# Patient Record
Sex: Male | Born: 1970
Health system: Southern US, Community
[De-identification: ages and names within clinical notes are randomized; demographics above are authoritative.]

## PROBLEM LIST (undated history)

## (undated) DIAGNOSIS — F411 Generalized anxiety disorder: Secondary | ICD-10-CM

## (undated) DIAGNOSIS — N200 Calculus of kidney: Secondary | ICD-10-CM

## (undated) DIAGNOSIS — E785 Hyperlipidemia, unspecified: Secondary | ICD-10-CM

## (undated) DIAGNOSIS — M1990 Unspecified osteoarthritis, unspecified site: Secondary | ICD-10-CM

## (undated) DIAGNOSIS — T7840XA Allergy, unspecified, initial encounter: Secondary | ICD-10-CM

## (undated) DIAGNOSIS — G4730 Sleep apnea, unspecified: Secondary | ICD-10-CM

## (undated) HISTORY — DX: Hyperlipidemia, unspecified: E78.5

## (undated) HISTORY — DX: Sleep apnea, unspecified: G47.30

## (undated) HISTORY — DX: Generalized anxiety disorder: F41.1

## (undated) HISTORY — DX: Calculus of kidney: N20.0

## (undated) HISTORY — DX: Allergy, unspecified, initial encounter: T78.40XA

## (undated) HISTORY — DX: Unspecified osteoarthritis, unspecified site: M19.90

---

## 1989-09-24 HISTORY — PX: INGUINAL HERNIA REPAIR: SHX194

## 2004-02-23 HISTORY — PX: VASECTOMY: SHX75

## 2004-09-24 DIAGNOSIS — F411 Generalized anxiety disorder: Secondary | ICD-10-CM

## 2004-09-24 HISTORY — DX: Generalized anxiety disorder: F41.1

## 2011-05-16 ENCOUNTER — Encounter: Payer: Self-pay | Admitting: Family Medicine

## 2011-05-16 ENCOUNTER — Ambulatory Visit (INDEPENDENT_AMBULATORY_CARE_PROVIDER_SITE_OTHER): Payer: Managed Care, Other (non HMO) | Admitting: Family Medicine

## 2011-05-16 VITALS — BP 108/76 | HR 62 | Ht 74.0 in | Wt 214.0 lb

## 2011-05-16 DIAGNOSIS — E781 Pure hyperglyceridemia: Secondary | ICD-10-CM

## 2011-05-16 DIAGNOSIS — Z0000 Encounter for general adult medical examination without abnormal findings: Secondary | ICD-10-CM

## 2011-05-16 LAB — POCT URINALYSIS DIPSTICK
Bilirubin, UA: NEGATIVE
Leukocytes, UA: NEGATIVE
Nitrite, UA: NEGATIVE
Urobilinogen, UA: NEGATIVE
pH, UA: 5

## 2011-05-16 NOTE — Patient Instructions (Signed)
HEALTH MAINTENANCE RECOMMENDATIONS:  It is recommended that you get at least 30 minutes of aerobic exercise at least 5 days/week (for weight loss, you may need as much as 60-90 minutes). This can be any activity that gets your heart rate up. This can be divided in 10-15 minute intervals if needed, but try and build up your endurance at least once a week.  Weight bearing exercise is also recommended twice weekly.  Eat a healthy diet with lots of vegetables, fruits and fiber.  "Colorful" foods have a lot of vitamins (ie green vegetables, tomatoes, red peppers, etc).  Limit sweet tea, regular sodas and alcoholic beverages, all of which has a lot of calories and sugar.  Up to 2 alcoholic drinks daily may be beneficial for women (unless trying to lose weight, watch sugars).  Drink a lot of water.  Sunscreen of at least SPF 30 should be used on all sun-exposed parts of the skin when outside between the hours of 10 am and 4 pm (not just when at beach or pool, but even with exercise, golf, tennis, and yard work!)  Use a sunscreen that says "broad spectrum" so it covers both UVA and UVB rays, and make sure to reapply every 1-2 hours.  Remember to change the batteries in your smoke detectors when changing your clock times in the spring and fall.  Use your seat belt every time you are in a car, and please drive safely and not be distracted with cell phones and texting while driving.

## 2011-05-16 NOTE — Progress Notes (Signed)
Gregory Wright is a 40 y.o. male who presents for a complete physical.  He has the following concerns: Triglycerides were borderline in the past, is fasting for repeat labs.  He has no complaints   There is no immunization history on file for this patient. Tetanus--patient can't recall, believes it UTD, might be documented in old records from [facility] Doesn't get flu shots Last colonoscopy: never Last PSA: never Ophtho:  scheduled for September Dentist: regularly Exercise: basketball, tennis (doubles), mountain biking--twice weekly  Past Medical History  Diagnosis Date  . Anxiety state, unspecified 2006    work-related, resolved    Past Surgical History  Procedure Date  . Inguinal hernia repair 1991    left  . Vasectomy     History   Social History  . Marital Status: Married    Spouse Name: Sherill Wegener    Number of Children: 2  . Years of Education: N/A   Occupational History  . finance    Social History Main Topics  . Smoking status: Never Smoker   . Smokeless tobacco: Never Used  . Alcohol Use: Yes     1-2 beers a week  . Drug Use: No  . Sexually Active: Yes    Birth Control/ Protection: Surgical     vasectomy   Other Topics Concern  . Not on file   Social History Narrative  . No narrative on file    Family History  Problem Relation Age of Onset  . Hypertension Father   . Graves' disease Father   . Diabetes Neg Hx   . Cancer Cousin 37    lung (farmer, nonsmoker)--1st cousin    No current outpatient prescriptions on file.  No Known Allergies  ROS: The patient denies anorexia, fever, weight changes, headaches,  vision loss, decreased hearing, ear pain, hoarseness, chest pain, palpitations, dizziness, syncope, dyspnea on exertion, cough, swelling, nausea, vomiting, diarrhea, constipation, abdominal pain, melena, hematochezia, indigestion/heartburn, hematuria, incontinence, erectile dysfunction, nocturia, weakened urine stream, dysuria,  joint pains,  numbness, tingling, weakness, tremor, suspicious skin lesions, depression, anxiety, abnormal bleeding/bruising, or enlarged lymph nodes  Seasonal allergies in spring/fall, no problems now.  PHYSICAL EXAM: BP 108/76  Pulse 62  Ht 6\' 2"  (1.88 m)  Wt 214 lb (97.07 kg)  BMI 27.48 kg/m2  General Appearance:    Alert, cooperative, no distress, appears stated age  Head:    Normocephalic, without obvious abnormality, atraumatic  Eyes:    PERRL, conjunctiva/corneas clear, EOM's intact, fundi    benign  Ears:    Normal TM's and external ear canals  Nose:   Nares normal, mucosa normal, no drainage or sinus   tenderness  Throat:   Lips, mucosa, and tongue normal; teeth and gums normal  Neck:   Supple, no lymphadenopathy;  thyroid:  no   enlargement/tenderness/nodules; no carotid   bruit or JVD  Back:    Spine nontender, no curvature, ROM normal, no CVA     tenderness  Lungs:     Clear to auscultation bilaterally without wheezes, rales or     ronchi; respirations unlabored  Chest Wall:    No tenderness or deformity   Heart:    Regular rate and rhythm, S1 and S2 normal, no murmur, rub   or gallop  Breast Exam:    No chest wall tenderness, masses or gynecomastia  Abdomen:     Soft, non-tender, nondistended, normoactive bowel sounds,    no masses, no hepatosplenomegaly  Genitalia:    Normal male external  genitalia without lesions.  Testicles without masses.  No inguinal hernias.  Rectal:    Normal sphincter tone, no masses or tenderness; guaiac negative stool.  Prostate smooth, no nodules, not enlarged.  Extremities:   No clubbing, cyanosis or edema  Pulses:   2+ and symmetric all extremities  Skin:   Skin color, texture, turgor normal, no rashes or lesions  Lymph nodes:   Cervical, supraclavicular, and axillary nodes normal  Neurologic:   CNII-XII intact, normal strength, sensation and gait; reflexes 2+ and symmetric throughout          Psych:   Normal mood, affect, hygiene and  grooming.    ASSESSMENT/PLAN: 1. Routine general medical examination at a health care facility  POCT Urinalysis Dipstick, Visual acuity screening  2. Pure hyperglyceridemia  Lipid panel    Recommended at least 30 minutes of aerobic activity at least 5 days/week, self-testicular exams. Immunization recommendations discussed--check Tetanus status in [facility] records when they arrive. See handout given to pt.

## 2011-05-17 ENCOUNTER — Encounter: Payer: Self-pay | Admitting: Family Medicine

## 2011-05-17 LAB — LIPID PANEL
HDL: 50 mg/dL (ref >39)
LDL Cholesterol: 130 mg/dL — ABNORMAL HIGH (ref 0–99)

## 2011-06-04 ENCOUNTER — Encounter: Payer: Self-pay | Admitting: *Deleted

## 2011-11-14 ENCOUNTER — Encounter: Payer: Self-pay | Admitting: Family Medicine

## 2011-11-14 ENCOUNTER — Ambulatory Visit (INDEPENDENT_AMBULATORY_CARE_PROVIDER_SITE_OTHER): Payer: Managed Care, Other (non HMO) | Admitting: Family Medicine

## 2011-11-14 DIAGNOSIS — N508 Other specified disorders of male genital organs: Secondary | ICD-10-CM

## 2011-11-14 DIAGNOSIS — N5089 Other specified disorders of the male genital organs: Secondary | ICD-10-CM | POA: Insufficient documentation

## 2011-11-14 NOTE — Patient Instructions (Signed)
Testicular Masses Most testicular masses, such as a growth or a swelling, are benign. This means they are not cancerous. Common types of testicular masses include:   Hydrocele is the most common benign testicular mass in an adult. Hydroceles are generally soft, painless scrotal swellings that are collections of fluid in the scrotal sac. These can rapidly change size as the fluid enters or leaves.   Spermatoceles are generally soft, painless, benign swellings that are cyst-like masses in the scrotum containing fluid. They can rapidly change size as the fluid enters or leaves. They are more prominent while standing or exercising. Sometimes, spermatoceles may cause a sensation of heaviness or a dull ache.   Varicocele is an enlargement of the veins that drain the testicles. This condition can increase the risk of infertility. They are more prominent while standing or exercising. Sometimes, varicoceles may cause a sensation of heaviness or a dull ache.   Inguinal hernia is a bulge caused by a portion of intestine protruding into the scrotum through a weak area in the abdominal muscles. Hernias may or may not be painful. They are soft and usually enlarge with coughing or straining.   Torsion of the testis can cause a testicular mass that develops quickly and is associated with tenderness and/or fever. This is caused by a twisting of the testicle within the sac. It also reduces the blood supply and can destroy the testis if not treated quickly with surgery.   Epididymitis is inflammation of the epididymis (a structure attached to the testicle), usually caused by a sexually transmitted infection or a urinary tract infection. This generally shows up as testicular discomfort and swelling, and may include pain during urination.   Testicular appendages are remnants of tissue on the testis present since birth. A testicular appendage can twist on its blood supply and cause pain. In most cases, this is seen as a  blue dot on the scrotum.  A cancerous growth in the scrotum may first appear as a swelling. There may or may not be pain. The growth usually feels firm and shows up as a growth on the testicle. Any solid, firm growth in a testicle is considered cancer until proven otherwise. Cancer of the testicle most commonly affects men 18 to 42 years old. Risk factors include prior testicular tumor and cryptorchidism (undescended testis). Occasionally, testicular cancer may appear with symptoms (problems) of metastasis. This means the tumor (abnormal growth) has spread and is causing other problems that may include cough, shortness of breath or weight loss. Monthly testicular self-exams are recommended for all men. Get in the habit of examining your own testicles. A good time is while taking a shower. Get to know what your testicles feel like so you will know if there is a new growth or change in them. DIAGNOSIS  See your caregiver if you feel a growth in your testicle. Sometimes, all that is needed to make the diagnosis (determine what is wrong) is a physical exam. Your caregiver may shine a bright light through the scrotum to help make the diagnosis. This is called transillumination. The light will shine easily through a collection of fluid but will usually not shine through a tumor. Other testing, including blood tests and an ultrasound exam, may be done. An ultrasound exam bounces harmless sound waves off the testicles and produces a black and white picture almost like that produced by a camera. Diagnosis of testicular cancer can be made by measuring several substances in the blood, called markers), that may  indicate the presence of certain cancers. TREATMENT  What is wrong determines how it is treated. Small hydroceles and spermatoceles often require no treatment. In some cases, however, they may be treated surgically. Hernias are repaired with surgery. Because epididymitis is usually caused by an infection, it is  usually treated with antibiotics. Varicoceles may be treated by surgery to tie off the affected veins. Testicular cancer treatment depends upon the type of cancer. Sometimes, some tissue is removed surgically as a way of trying to preserve the testicle but if a tumor is suspected, the preferred treatment is removal of the entire testicle. Further treatment may include watching the growth with strict follow-up, chemotherapy or radiation. If a growth has been found in a testicle, your caregiver will help you determine the best treatment. Document Released: 03/17/2003 Document Revised: 05/23/2011 Document Reviewed: 09/10/2005 [facility] Patient Information 2012 [place], [place].    The lumps in the scrotum may be related to prior vasectomy.  As long as it isn't painful, growing in size, or causing any problems, let's continue to observe.  Contact me if things change, become symptomatic

## 2011-11-14 NOTE — Progress Notes (Signed)
Monday night he noticed a marble-sized mass in left scrotum.  Nontender. Wife also noticed a lump on the right side, but much smaller in size. Hasn't changed in the last 2 days.  No injury or trauma. +h/o vasectomy 2005.   Denies fevers, dysuria, pain with ejaculation, bleed in semen, hematuria or other concerns.  Past Medical History  Diagnosis Date  . Anxiety state, unspecified 2006    work-related, resolved  . Allergy     seasonal    Past Surgical History  Procedure Date  . Inguinal hernia repair 1991    left  . Vasectomy 6/05    History   Social History  . Marital Status: Married    Spouse Name: Bernell Sigal    Number of Children: 2  . Years of Education: N/A   Occupational History  . finance    Social History Main Topics  . Smoking status: Never Smoker   . Smokeless tobacco: Never Used  . Alcohol Use: Yes     1-2 beers a week  . Drug Use: No  . Sexually Active: Yes    Birth Control/ Protection: Surgical     vasectomy   Other Topics Concern  . Not on file   Social History Narrative  . No narrative on file   Family History  Problem Relation Age of Onset  . Hypertension Father   . Graves' disease Father   . Diabetes Neg Hx   . Cancer Cousin 37    lung (farmer, nonsmoker)--1st cousin   No current outpatient prescriptions on file.  No Known Allergies  ROS:  Denies fevers, URI symptoms, cough, shortness of breath, chest pain, nausea, vomiting, diarrhea, GU complaints, skin rash, or other concerns  PHYSICAL EXAM: BP 100/78  Pulse 60  Ht 6\' 2"  (1.88 m)  Wt 215 lb (97.523 kg)  BMI 27.60 kg/m2 Well developed male, in no distress  L scrotum, inferoposteriorly--1.5-2cm mobile, firm, nontender mass, that is free-floating, and clearly separate from the testicle. Testicle is smooth, no nodules or masses.  R scrotum--normal testicle.  Much smaller firm nodule, 1/2 cm, located medially No hernias, no inguinal lymphadenopathy. Normal scrotal  skin  ASSESSMENT/PLAN: 1. Scrotal mass    Most likely sperm collection related to prior vasectomy.  Reviewed differential diagnosis of scrotal masses.  Reassured no testicular mass, likely benign.  Continued surveillance

## 2013-02-24 ENCOUNTER — Encounter: Payer: Self-pay | Admitting: Internal Medicine

## 2013-03-09 ENCOUNTER — Encounter: Payer: Self-pay | Admitting: Family Medicine

## 2013-03-09 ENCOUNTER — Ambulatory Visit (INDEPENDENT_AMBULATORY_CARE_PROVIDER_SITE_OTHER): Payer: Managed Care, Other (non HMO) | Admitting: Family Medicine

## 2013-03-09 VITALS — BP 104/76 | HR 60 | Ht 74.0 in | Wt 206.0 lb

## 2013-03-09 DIAGNOSIS — Z0000 Encounter for general adult medical examination without abnormal findings: Secondary | ICD-10-CM

## 2013-03-09 DIAGNOSIS — E7800 Pure hypercholesterolemia, unspecified: Secondary | ICD-10-CM

## 2013-03-09 DIAGNOSIS — R5381 Other malaise: Secondary | ICD-10-CM

## 2013-03-09 LAB — COMPREHENSIVE METABOLIC PANEL WITH GFR
ALT: 18 U/L (ref 0–53)
AST: 16 U/L (ref 0–37)
Albumin: 4.6 g/dL (ref 3.5–5.2)
Alkaline Phosphatase: 77 U/L (ref 39–117)
BUN: 18 mg/dL (ref 6–23)
CO2: 26 meq/L (ref 19–32)
Calcium: 9.6 mg/dL (ref 8.4–10.5)
Chloride: 101 meq/L (ref 96–112)
Creat: 1.06 mg/dL (ref 0.50–1.35)
Glucose, Bld: 107 mg/dL — ABNORMAL HIGH (ref 70–99)
Potassium: 4.4 meq/L (ref 3.5–5.3)
Sodium: 138 meq/L (ref 135–145)
Total Bilirubin: 0.7 mg/dL (ref 0.3–1.2)
Total Protein: 7.1 g/dL (ref 6.0–8.3)

## 2013-03-09 LAB — CBC WITH DIFFERENTIAL/PLATELET
Basophils Absolute: 0 K/uL (ref 0.0–0.1)
Basophils Relative: 0 % (ref 0–1)
Eosinophils Absolute: 0.1 K/uL (ref 0.0–0.7)
Eosinophils Relative: 1 % (ref 0–5)
HCT: 42.7 % (ref 39.0–52.0)
Hemoglobin: 15.1 g/dL (ref 13.0–17.0)
Lymphocytes Relative: 33 % (ref 12–46)
Lymphs Abs: 1.6 K/uL (ref 0.7–4.0)
MCH: 32.2 pg (ref 26.0–34.0)
MCHC: 35.4 g/dL (ref 30.0–36.0)
MCV: 91 fL (ref 78.0–100.0)
Monocytes Absolute: 0.3 K/uL (ref 0.1–1.0)
Monocytes Relative: 7 % (ref 3–12)
Neutro Abs: 2.8 K/uL (ref 1.7–7.7)
Neutrophils Relative %: 59 % (ref 43–77)
Platelets: 223 K/uL (ref 150–400)
RBC: 4.69 MIL/uL (ref 4.22–5.81)
RDW: 13.8 % (ref 11.5–15.5)
WBC: 4.9 K/uL (ref 4.0–10.5)

## 2013-03-09 LAB — POCT URINALYSIS DIPSTICK
Bilirubin, UA: NEGATIVE
Blood, UA: NEGATIVE
Glucose, UA: NEGATIVE
Ketones, UA: NEGATIVE
Leukocytes, UA: NEGATIVE
Nitrite, UA: NEGATIVE
Protein, UA: NEGATIVE
Spec Grav, UA: 1.01
Urobilinogen, UA: NEGATIVE
pH, UA: 7

## 2013-03-09 LAB — LIPID PANEL
Cholesterol: 202 mg/dL — ABNORMAL HIGH (ref 0–200)
HDL: 61 mg/dL
LDL Cholesterol: 126 mg/dL — ABNORMAL HIGH (ref 0–99)
Total CHOL/HDL Ratio: 3.3 ratio
Triglycerides: 76 mg/dL
VLDL: 15 mg/dL (ref 0–40)

## 2013-03-09 LAB — TSH: TSH: 0.933 u[IU]/mL (ref 0.350–4.500)

## 2013-03-09 NOTE — Patient Instructions (Addendum)

## 2013-03-09 NOTE — Progress Notes (Signed)
Chief Complaint  Patient presents with  . Annual Exam    fasting annual exam. DId not do eye exam as he just recently had one with Dr.Scott. No major concerns today.    Gregory Wright is a 42 y.o. male who presents for a complete physical.  He has the following concerns:  None. Scrotal mass unchanged. Occasional LBP, none currently.   Immunization History  Administered Date(s) Administered  . Tdap 03/26/2007  Doesn't get flu shots  Last colonoscopy: never  Last PSA: never  Ophtho: within 6 months, with Dr. Lorin Picket Dentist: regularly  Exercise: basketball, tennis (doubles), mountain biking occasionally; goes to gym 2x/week  Past Medical History  Diagnosis Date  . Anxiety state, unspecified 2006    work-related, resolved  . Allergy     seasonal  . Hyperlipidemia     mild    Past Surgical History  Procedure Laterality Date  . Inguinal hernia repair  1991    left  . Vasectomy  6/05    History   Social History  . Marital Status: Married    Spouse Name: Toni Hoffmeister    Number of Children: 2  . Years of Education: N/A   Occupational History  . finance and operations/process efficiency   .     Social History Main Topics  . Smoking status: Never Smoker   . Smokeless tobacco: Never Used  . Alcohol Use: Yes     Comment: 1-2 beers a week  . Drug Use: No  . Sexually Active: Yes    Birth Control/ Protection: Surgical     Comment: vasectomy   Other Topics Concern  . Not on file   Social History Narrative   Married, 2 sons, 2 dogs    Family History  Problem Relation Age of Onset  . Hypertension Father   . Graves' disease Father   . Diabetes Neg Hx   . Cancer Cousin 37    lung (farmer, nonsmoker)--1st cousin  . Cancer Maternal Aunt     spread to lungs, ?primary; also had brain tumor   No current outpatient prescriptions on file.  No Known Allergies  ROS: The patient denies anorexia, fever, weight changes, headaches, vision loss, decreased hearing, ear pain,  hoarseness, chest pain, palpitations, dizziness, syncope, dyspnea on exertion, cough, swelling, nausea, vomiting, diarrhea, constipation, abdominal pain, melena, hematochezia, indigestion/heartburn, hematuria, incontinence, erectile dysfunction, nocturia, weakened urine stream, dysuria, joint pains, numbness, tingling, weakness, tremor, suspicious skin lesions, depression, anxiety, abnormal bleeding/bruising, or enlarged lymph nodes  Seasonal allergies in spring/fall, no problems now. Uses claritin prn with good results (mostly when pollen is bad)  PHYSICAL EXAM: BP 104/76  Pulse 60  Ht 6\' 2"  (1.88 m)  Wt 206 lb (93.441 kg)  BMI 26.44 kg/m2  General Appearance:  Alert, cooperative, no distress, appears stated age   Head:  Normocephalic, without obvious abnormality, atraumatic   Eyes:  PERRL, conjunctiva/corneas clear, EOM's intact, fundi  benign   Ears:  Normal TM's and external ear canals   Nose:  Nares normal, mucosa normal, no drainage or sinus tenderness   Throat:  Lips, mucosa, and tongue normal; teeth and gums normal   Neck:  Supple, no lymphadenopathy; thyroid: no enlargement/tenderness/nodules; no carotid  bruit or JVD   Back:  Spine nontender, no curvature, ROM normal, no CVA tenderness   Lungs:  Clear to auscultation bilaterally without wheezes, rales or ronchi; respirations unlabored   Chest Wall:  No tenderness or deformity   Heart:  Regular rate and  rhythm, S1 and S2 normal, no murmur, rub  or gallop   Breast Exam:  No chest wall tenderness, masses or gynecomastia   Abdomen:  Soft, non-tender, nondistended, normoactive bowel sounds,  no masses, no hepatosplenomegaly   Genitalia:  Normal male external genitalia without lesions. Testicles without masses. No inguinal hernias.  L scrotum, inferoposteriorly--1.5-2cm mobile, firm, nontender mass, that is free-floating, and clearly separate from the testicle. Testicle is smooth, no nodules or masses. R scrotum--normal testicle. Much  smaller firm nodule, 1 cm, nontender. No adenopathy  Rectal:  Normal sphincter tone, no masses or tenderness; guaiac negative stool. Prostate smooth, no nodules, not enlarged.   Extremities:  No clubbing, cyanosis or edema   Pulses:  2+ and symmetric all extremities   Skin:  Skin color, texture, turgor normal, no rashes or lesions   Lymph nodes:  Cervical, supraclavicular, and axillary nodes normal   Neurologic:  CNII-XII intact, normal strength, sensation and gait; reflexes 2+ and symmetric throughout          Psych: Normal mood, affect, hygiene and grooming.    ASSESSMENT/PLAN:  Routine general medical examination at a health care facility - Plan: POCT Urinalysis Dipstick, Lipid panel, Comprehensive metabolic panel, CBC with Differential, TSH  Other malaise and fatigue - Plan: Comprehensive metabolic panel, CBC with Differential, TSH  Pure hypercholesterolemia - Plan: Lipid panel  Lipids reviewed from 2 years ago--were improved from those in years past.  Last glu was 2011.  Full chem panel was in 2006, never had CBC or thyroid testing.  Recommended at least 30 minutes of aerobic activity at least 5 days/week, self-testicular exams. Immunization recommendations are up to date, flu shots recommended (but not required).  Colonoscopy age 56.  Scrotal masses unchanged, likely changes s/p vasectomy. No evidence of testicular mass or infection.

## 2013-03-10 ENCOUNTER — Encounter: Payer: Self-pay | Admitting: Family Medicine

## 2013-03-11 ENCOUNTER — Other Ambulatory Visit: Payer: Self-pay | Admitting: *Deleted

## 2013-03-11 DIAGNOSIS — E7800 Pure hypercholesterolemia, unspecified: Secondary | ICD-10-CM

## 2013-03-11 DIAGNOSIS — R7301 Impaired fasting glucose: Secondary | ICD-10-CM

## 2013-07-30 ENCOUNTER — Other Ambulatory Visit: Payer: Self-pay

## 2014-04-19 ENCOUNTER — Encounter: Payer: Self-pay | Admitting: Family Medicine

## 2014-04-19 ENCOUNTER — Ambulatory Visit (INDEPENDENT_AMBULATORY_CARE_PROVIDER_SITE_OTHER): Payer: BC Managed Care – PPO | Admitting: Family Medicine

## 2014-04-19 VITALS — BP 104/64 | HR 60 | Ht 75.0 in | Wt 214.0 lb

## 2014-04-19 DIAGNOSIS — Z0000 Encounter for general adult medical examination without abnormal findings: Secondary | ICD-10-CM

## 2014-04-19 DIAGNOSIS — E7800 Pure hypercholesterolemia, unspecified: Secondary | ICD-10-CM

## 2014-04-19 DIAGNOSIS — R7301 Impaired fasting glucose: Secondary | ICD-10-CM

## 2014-04-19 LAB — POCT URINALYSIS DIPSTICK
Bilirubin, UA: NEGATIVE
GLUCOSE UA: NEGATIVE
KETONES UA: NEGATIVE
Leukocytes, UA: NEGATIVE
Nitrite, UA: NEGATIVE
Protein, UA: NEGATIVE
RBC UA: NEGATIVE
SPEC GRAV UA: 1.015
Urobilinogen, UA: NEGATIVE
pH, UA: 5

## 2014-04-19 LAB — HEMOGLOBIN A1C
Hgb A1c MFr Bld: 5.7 % — ABNORMAL HIGH (ref <5.7)
Mean Plasma Glucose: 117 mg/dL — ABNORMAL HIGH (ref <117)

## 2014-04-19 NOTE — Patient Instructions (Signed)

## 2014-04-19 NOTE — Progress Notes (Signed)
Chief Complaint  Patient presents with  . Annual Exam    fasting annual exam, no concerns. Did not do eye exam, had one last month Dr.Jon Scott.    Gregory Wright is a 43 y.o. male who presents for a complete physical.  He has no specific concerns.   He had elevated glucose last year of 107.  He was supposed to return in 6 months for fasting labs, but did not (A1c, glucose and lipids ordered).  He admits to drinking a lot of soda (no coffee)--he drinks [employer] Coke Zero, now down to 2 cans/day. In the past he drank four 20 ounce regular sodas daily (a couple of years ago).   He is walking more in the evenings since getting his FitBit for Father's Day. He is also active on the job. He had been exercising more, working with trainer prior to physical last year, but never restarted after his vacation last year. He just got back from [place] [facility] [place]--he was drinking up to 5-6 beers/day (over the course of the day), some fried foods, but mostly grilling.  Immunization History  Administered Date(s) Administered  . Tdap 03/26/2007   Doesn't get flu shots  Last colonoscopy: never  Last PSA: never  Ophtho: every 1-1.5 months, Dr. Lorin PicketScott, last in 02/2014 Dentist: regularly  Exercise: basketball, tennis (doubles), walking with his wife. mountain biking occasionally; goes to gym 2x/week (elliptical, stationary bike--in winter only, not recent).  Past Medical History  Diagnosis Date  . Anxiety state, unspecified 2006    work-related, resolved  . Allergy     seasonal  . Hyperlipidemia     mild    Past Surgical History  Procedure Laterality Date  . Inguinal hernia repair  1991    left  . Vasectomy  6/05    History   Social History  . Marital Status: Married    Spouse Name: Bayard Huggerrin Kinoshita    Number of Children: 2  . Years of Education: N/A   Occupational History  . finance and operations/process efficiency   .     Social History Main Topics  . Smoking status: Never  Smoker   . Smokeless tobacco: Never Used  . Alcohol Use: Yes     Comment: 1-2 beers a week (5-6/day while on beach vacation)  . Drug Use: No  . Sexual Activity: Yes    Birth Control/ Protection: Surgical     Comment: vasectomy   Other Topics Concern  . Not on file   Social History Narrative   Married, 2 sons, 1 dog    Family History  Problem Relation Age of Onset  . Hypertension Father   . Graves' disease Father   . Diabetes Neg Hx   . Cancer Cousin 37    lung (farmer, nonsmoker)--1st cousin  . Cancer Maternal Aunt     spread to lungs, ?primary; also had brain tumor    No current outpatient prescriptions on file.  No Known Allergies  ROS: The patient denies anorexia, fever, headaches, vision loss, decreased hearing, ear pain, hoarseness, chest pain, palpitations, dizziness, syncope, dyspnea on exertion, cough, swelling, nausea, vomiting, diarrhea, constipation, abdominal pain, melena, hematochezia, indigestion/heartburn, hematuria, incontinence, erectile dysfunction, nocturia, weakened urine stream, dysuria, joint pains, numbness, tingling, weakness, tremor, suspicious skin lesions, depression, anxiety, abnormal bleeding/bruising, or enlarged lymph nodes  Seasonal allergies in spring/fall, no problems now. Uses claritin prn with good results (mostly when pollen is bad)  Occasional sharp/short-lived back pain with "lightening  bolts" to the legs. +8 pound weight gain since last physical--just got back from a week's vacation at [facility]. Denies any changes to scrotal masses (unchanged/nontender/present s/p vasectomy)   PHYSICAL EXAM:  BP 104/64  Pulse 60  Ht 6\' 3"  (1.905 m)  Wt 214 lb (97.07 kg)  BMI 26.75 kg/m2  General Appearance:  Alert, cooperative, no distress, appears stated age   Head:  Normocephalic, without obvious abnormality, atraumatic   Eyes:  PERRL, conjunctiva/corneas clear, EOM's intact, fundi  benign   Ears:  Normal TM's and external ear canals   Nose:   Nares normal, mucosa normal, no drainage or sinus tenderness   Throat:  Lips, mucosa, and tongue normal; teeth and gums normal   Neck:  Supple, no lymphadenopathy; thyroid: no enlargement/tenderness/nodules; no carotid  bruit or JVD   Back:  Spine nontender, no curvature, ROM normal, no CVA tenderness   Lungs:  Clear to auscultation bilaterally without wheezes, rales or ronchi; respirations unlabored   Chest Wall:  No tenderness or deformity   Heart:  Regular rate and rhythm, S1 and S2 normal, no murmur, rub  or gallop   Breast Exam:  No chest wall tenderness, masses or gynecomastia   Abdomen:  Soft, non-tender, nondistended, normoactive bowel sounds,  no masses, no hepatosplenomegaly   Genitalia:  Normal male external genitalia without lesions. Testicles without masses. No inguinal hernias.There are nontender, mobile cysts infero-posterior to the testicles bilaterally, approx 1-2 cm in size.  No inguinal lymphadenopathy  Rectal:  Normal sphincter tone, no masses or tenderness; guaiac negative stool. Prostate smooth, no nodules, not enlarged.   Extremities:  No clubbing, cyanosis or edema   Pulses:  2+ and symmetric all extremities   Skin:  Skin color, texture, turgor normal, no rashes or lesions   Lymph nodes:  Cervical, supraclavicular, and axillary nodes normal   Neurologic:  CNII-XII intact, normal strength, sensation and gait; reflexes 2+ and symmetric throughout          Psych: Normal mood, affect, hygiene and grooming.   ASSESSMENT/PLAN:  Routine general medical examination at a health care facility - Plan: POCT Urinalysis Dipstick, Comprehensive metabolic panel  Pure hypercholesterolemia - Plan: Lipid Panel, Comprehensive metabolic panel  Impaired fasting glucose - Plan: Hemoglobin A1c, Comprehensive metabolic panel  Reviewed diet recommendations for IFG--limit carbs, sweets, sugared drinks.  Daily exercise is recommended.  Expect labs to not be as good related to being on  vacation last week (5-6 beers/day).  Recommended at least 30 minutes of aerobic activity at least 5 days/week, self-testicular exams. Immunization recommendations are up to date, flu shots recommended (but not required). Colonoscopy age 9.

## 2014-04-20 LAB — COMPREHENSIVE METABOLIC PANEL
ALK PHOS: 81 U/L (ref 39–117)
ALT: 26 U/L (ref 0–53)
AST: 19 U/L (ref 0–37)
Albumin: 4.4 g/dL (ref 3.5–5.2)
BUN: 22 mg/dL (ref 6–23)
CO2: 27 meq/L (ref 19–32)
CREATININE: 1.16 mg/dL (ref 0.50–1.35)
Calcium: 9.5 mg/dL (ref 8.4–10.5)
Chloride: 102 mEq/L (ref 96–112)
GLUCOSE: 106 mg/dL — AB (ref 70–99)
Potassium: 4.3 mEq/L (ref 3.5–5.3)
Sodium: 135 mEq/L (ref 135–145)
Total Bilirubin: 0.7 mg/dL (ref 0.2–1.2)
Total Protein: 7.2 g/dL (ref 6.0–8.3)

## 2014-04-20 LAB — LIPID PANEL
CHOL/HDL RATIO: 3.4 ratio
CHOLESTEROL: 202 mg/dL — AB (ref 0–200)
HDL: 60 mg/dL (ref >39)
LDL CALC: 128 mg/dL — AB (ref 0–99)
Triglycerides: 68 mg/dL (ref <150)
VLDL: 14 mg/dL (ref 0–40)

## 2015-04-21 ENCOUNTER — Ambulatory Visit (INDEPENDENT_AMBULATORY_CARE_PROVIDER_SITE_OTHER): Payer: BLUE CROSS/BLUE SHIELD | Admitting: Family Medicine

## 2015-04-21 ENCOUNTER — Encounter: Payer: Self-pay | Admitting: Family Medicine

## 2015-04-21 VITALS — BP 98/58 | HR 60 | Ht 75.0 in | Wt 217.2 lb

## 2015-04-21 DIAGNOSIS — R5383 Other fatigue: Secondary | ICD-10-CM

## 2015-04-21 DIAGNOSIS — Z0000 Encounter for general adult medical examination without abnormal findings: Secondary | ICD-10-CM

## 2015-04-21 DIAGNOSIS — E7800 Pure hypercholesterolemia, unspecified: Secondary | ICD-10-CM

## 2015-04-21 DIAGNOSIS — R7301 Impaired fasting glucose: Secondary | ICD-10-CM | POA: Diagnosis not present

## 2015-04-21 LAB — POCT URINALYSIS DIPSTICK
BILIRUBIN UA: NEGATIVE
Glucose, UA: NEGATIVE
KETONES UA: NEGATIVE
LEUKOCYTES UA: NEGATIVE
Nitrite, UA: NEGATIVE
PROTEIN UA: NEGATIVE
Urobilinogen, UA: NEGATIVE
pH, UA: 5.5

## 2015-04-21 LAB — CBC WITH DIFFERENTIAL/PLATELET
BASOS PCT: 0 % (ref 0–1)
Basophils Absolute: 0 10*3/uL (ref 0.0–0.1)
Eosinophils Absolute: 0.1 10*3/uL (ref 0.0–0.7)
Eosinophils Relative: 2 % (ref 0–5)
HEMATOCRIT: 43.9 % (ref 39.0–52.0)
HEMOGLOBIN: 15 g/dL (ref 13.0–17.0)
Lymphocytes Relative: 39 % (ref 12–46)
Lymphs Abs: 1.9 10*3/uL (ref 0.7–4.0)
MCH: 31.8 pg (ref 26.0–34.0)
MCHC: 34.2 g/dL (ref 30.0–36.0)
MCV: 93 fL (ref 78.0–100.0)
MONO ABS: 0.4 10*3/uL (ref 0.1–1.0)
MONOS PCT: 8 % (ref 3–12)
MPV: 9.7 fL (ref 8.6–12.4)
NEUTROS ABS: 2.5 10*3/uL (ref 1.7–7.7)
Neutrophils Relative %: 51 % (ref 43–77)
PLATELETS: 245 10*3/uL (ref 150–400)
RBC: 4.72 MIL/uL (ref 4.22–5.81)
RDW: 13.3 % (ref 11.5–15.5)
WBC: 4.9 10*3/uL (ref 4.0–10.5)

## 2015-04-21 LAB — COMPREHENSIVE METABOLIC PANEL
ALBUMIN: 4.6 g/dL (ref 3.6–5.1)
ALT: 20 U/L (ref 9–46)
AST: 18 U/L (ref 10–40)
Alkaline Phosphatase: 62 U/L (ref 40–115)
BUN: 24 mg/dL (ref 7–25)
CHLORIDE: 102 meq/L (ref 98–110)
CO2: 26 mEq/L (ref 20–31)
Calcium: 9.7 mg/dL (ref 8.6–10.3)
Creat: 1.17 mg/dL (ref 0.60–1.35)
Glucose, Bld: 96 mg/dL (ref 65–99)
POTASSIUM: 4.4 meq/L (ref 3.5–5.3)
Sodium: 137 mEq/L (ref 135–146)
Total Bilirubin: 0.8 mg/dL (ref 0.2–1.2)
Total Protein: 7.1 g/dL (ref 6.1–8.1)

## 2015-04-21 LAB — LIPID PANEL
CHOL/HDL RATIO: 3.7 ratio (ref <=5.0)
Cholesterol: 204 mg/dL — ABNORMAL HIGH (ref 125–200)
HDL: 55 mg/dL (ref >=40)
LDL CALC: 138 mg/dL — AB (ref <130)
Triglycerides: 56 mg/dL (ref <150)
VLDL: 11 mg/dL (ref <30)

## 2015-04-21 LAB — HEMOGLOBIN A1C
HEMOGLOBIN A1C: 5.9 % — AB (ref <5.7)
MEAN PLASMA GLUCOSE: 123 mg/dL — AB (ref <117)

## 2015-04-21 LAB — TSH: TSH: 1.082 u[IU]/mL (ref 0.350–4.500)

## 2015-04-21 NOTE — Progress Notes (Signed)
Chief Complaint  Patient presents with  . Annual Exam    fasting annual exam, no concerns. Has eye exam this past Monday with Dr.Brian Harris. UA showed trace blood, no symptoms per patient.    Gregory Wright is a 44 y.o. male who presents for a complete physical.   Impaired fasting glucose:  Last year his fasting glucose was 106, A1c 5.7.  We always see him for his physical about a week or so after his vacation to [facility], where he admits to drinking more alcohol than usual.  He was advised to "limit sugars, sweets (including sugars in drinks--juices, alcohol, etc), and try and watch/limit intake of carbs, especially the "white" ones--it is better to have brown rice, whole grain pasta, whole wheat bread, in place of the "white" kind, but still keep portions in check". He reports having more brown rice, whole wheat breads.  He cut back some on sodas, drinking more water. Trying to limit to just one soda daily, but sometimes on the weekends he will drink 4-5 sometimes. He has been drinking diet soda, only rarely having a regular soda.  He recently told his wife not to buy anymore soda, trying to give it up.  Immunization History  Administered Date(s) Administered  . Tdap 03/26/2007   Doesn't get flu shots  Last colonoscopy: never  Last PSA: never  Ophtho: yearly, saw Dr. Arnoldo Lenis earlier this week Dentist: regularly  Exercise: 5 days/week: basketball, tennis (doubles), walking with his wife. mountain biking occasionally; goes to gym 2x/week (elliptical, stationary bike--in winter only, not recent). Lipids: last year: Lab Results  Component Value Date   CHOL 202* 04/19/2014   HDL 60 04/19/2014   LDLCALC 128* 04/19/2014   TRIG 68 04/19/2014   CHOLHDL 3.4 04/19/2014   Past Medical History  Diagnosis Date  . Anxiety state, unspecified 2006    work-related, resolved  . Allergy     seasonal  . Hyperlipidemia     mild    Past Surgical History  Procedure Laterality Date  .  Inguinal hernia repair  1991    left  . Vasectomy  6/05    History   Social History  . Marital Status: Married    Spouse Name: Benigno Check  . Number of Children: 2  . Years of Education: N/A   Occupational History  . finance and operations/process efficiency   .     Social History Main Topics  . Smoking status: Never Smoker   . Smokeless tobacco: Never Used  . Alcohol Use: 0.0 oz/week    0 Standard drinks or equivalent per week     Comment: 1-2 beers a week, 5-6 over the weekends in the summer (8/day while on beach vacation for just 3 days)  . Drug Use: No  . Sexual Activity: Yes    Birth Control/ Protection: Surgical     Comment: vasectomy   Other Topics Concern  . Not on file   Social History Narrative   Married, 2 sons, 1 dog. Promoted 01/2015--runs [employer] facility.  Work 1 day/week late    Family History  Problem Relation Age of Onset  . Hypertension Father   . Graves' disease Father   . Cancer Cousin 37    lung (farmer, nonsmoker)--1st cousin  . Cancer Maternal Aunt     spread to lungs, ?primary; also had brain tumor  . Diabetes Paternal Grandmother     No outpatient encounter prescriptions on file as of 04/21/2015.   No  facility-administered encounter medications on file as of 04/21/2015.    No Known Allergies  ROS: The patient denies anorexia, fever, headaches, vision loss, decreased hearing, ear pain, hoarseness, chest pain, palpitations, dizziness, syncope, dyspnea on exertion, cough, swelling, nausea, vomiting, diarrhea, constipation, abdominal pain, melena, hematochezia, indigestion/heartburn, hematuria, incontinence, erectile dysfunction, nocturia, weakened urine stream, dysuria, joint pains, numbness, tingling, weakness, tremor, suspicious skin lesions, depression, anxiety, abnormal bleeding/bruising, or enlarged lymph nodes  Seasonal allergies in spring/fall, no problems now. Uses claritin prn with good results (mostly when pollen is  bad)  Occasional sharp/short-lived back pain with "lightening bolts" to the legs. Unchanged, same as the last 10 years (weekly). +3 pound weight gain since last physical, up 11# in the last 2 years. Denies any changes to scrotal masses (unchanged/nontender/present s/p vasectomy)   PHYSICAL EXAM:  BP 98/58 mmHg  Pulse 60  Ht  (1.905 m)  Wt 217 lb 3.2 oz (98.521 kg)  BMI 27.15 kg/m2  General Appearance:  Alert, cooperative, no distress, appears stated age   Head:  Normocephalic, without obvious abnormality, atraumatic   Eyes:  PERRL, conjunctiva/corneas clear, EOM's intact, fundi  benign   Ears:  Normal TM's and external ear canals   Nose:  Nares normal, mucosa normal, no drainage or sinus tenderness   Throat:  Lips, mucosa, and tongue normal; teeth and gums normal   Neck:  Supple, no lymphadenopathy; thyroid: no enlargement/tenderness/nodules; no carotid  bruit or JVD   Back:  Spine nontender, no curvature, ROM normal, no CVA tenderness   Lungs:  Clear to auscultation bilaterally without wheezes, rales or ronchi; respirations unlabored   Chest Wall:  No tenderness or deformity   Heart:  Regular rate and rhythm, S1 and S2 normal, no murmur, rub  or gallop   Breast Exam:  No chest wall tenderness, masses or gynecomastia   Abdomen:  Soft, non-tender, nondistended, normoactive bowel sounds,  no masses, no hepatosplenomegaly   Genitalia:  Normal male external genitalia without lesions. Testicles without masses. No inguinal hernias.There are nontender, mobile cysts infero-posterior to the testicles bilaterally. No inguinal lymphadenopathy  Rectal:  Normal sphincter tone, no masses or tenderness; guaiac negative stool. Prostate smooth, no nodules, not enlarged.   Extremities:  No clubbing, cyanosis or edema   Pulses:  2+ and symmetric all extremities   Skin:  Skin color, texture, turgor normal, no rashes or lesions   Lymph nodes:  Cervical,  supraclavicular, and axillary nodes normal   Neurologic:  CNII-XII intact, normal strength, sensation and gait; reflexes 2+ and symmetric throughout     Psych: Normal mood, affect, hygiene and grooming.         ASSESSMENT/PLAN:  Annual physical exam - Plan: POCT Urinalysis Dipstick, Lipid panel, CBC with Differential/Platelet, Comprehensive metabolic panel, TSH, Hemoglobin A1c  Impaired fasting glucose - reviewed diet, exercise - Plan: Comprehensive metabolic panel, Hemoglobin A1c  Pure hypercholesterolemia - Plan: Lipid panel  Other fatigue - Plan: CBC with Differential/Platelet, Comprehensive metabolic panel, TSH  If labs okay, next year will likely only need glucose and A1c.  Recommended at least 30 minutes of aerobic activity at least 5 days/week, weight-bearing exercise 2x/week; Monthly self-testicular exams. Immunization recommendations are up to date, flu shots recommended each Fall. Colonoscopy age 43.

## 2015-04-21 NOTE — Patient Instructions (Signed)
  HEALTH MAINTENANCE RECOMMENDATIONS:  It is recommended that you get at least 30 minutes of aerobic exercise at least 5 days/week (for weight loss, you may need as much as 60-90 minutes). This can be any activity that gets your heart rate up. This can be divided in 10-15 minute intervals if needed, but try and build up your endurance at least once a week.  Weight bearing exercise is also recommended twice weekly.  Eat a healthy diet with lots of vegetables, fruits and fiber.  "Colorful" foods have a lot of vitamins (ie green vegetables, tomatoes, red peppers, etc).  Limit sweet tea, regular sodas and alcoholic beverages, all of which has a lot of calories and sugar.  Up to 2 alcoholic drinks daily may be beneficial for men (unless trying to lose weight, watch sugars).  Drink a lot of water.  Sunscreen of at least SPF 30 should be used on all sun-exposed parts of the skin when outside between the hours of 10 am and 4 pm (not just when at beach or pool, but even with exercise, golf, tennis, and yard work!)  Use a sunscreen that says "broad spectrum" so it covers both UVA and UVB rays, and make sure to reapply every 1-2 hours.  Remember to change the batteries in your smoke detectors when changing your clock times in the spring and fall.  Use your seat belt every time you are in a car, and please drive safely and not be distracted with cell phones and texting while driving.    I recommend taking a men's multivitamin once daily.

## 2015-05-26 DIAGNOSIS — N200 Calculus of kidney: Secondary | ICD-10-CM

## 2015-05-26 HISTORY — DX: Calculus of kidney: N20.0

## 2015-06-20 ENCOUNTER — Telehealth: Payer: Self-pay | Admitting: Family Medicine

## 2015-06-20 ENCOUNTER — Telehealth: Payer: Self-pay | Admitting: *Deleted

## 2015-06-20 DIAGNOSIS — N200 Calculus of kidney: Secondary | ICD-10-CM

## 2015-06-20 NOTE — Telephone Encounter (Signed)
Gregory Wright, wife called back and took him to UC.

## 2015-06-20 NOTE — Telephone Encounter (Signed)
Patient called and states that he was at work and began feeling an sharp left lower abdominal pain that comes and goes. Broke into cold sweats and vomited one time. No known fever or diarrhea. Going home to lay down. He knows that this could be related to something he ate or even a virus. He just wanted to make sure that you didn't think was a warning sign of anything worse.

## 2015-06-20 NOTE — Telephone Encounter (Signed)
Gregory Wright was seen in UC today with kidney stone, given strainer, and he has already passed a stone. It should be sent to the lab for evaluation to determine the type of kidney stone it was.  Should pt/wife bring it to the office, and Alvino Chapel can send to lab (please enter order for pathology?? Not sure of exact order), vs if they should drop at another lab (but still need order placed).  Thanks

## 2015-06-20 NOTE — Telephone Encounter (Signed)
Spoke with wife--suspect kidney stone. He was feeling much better. Rx'd indomethacin and zofran. Wife later texted me photo of a black stone that was passed. Recommended it be tested. He was feeling better. (advised that if there are more stones/recurrent pain, that stronger pain meds might be needed, and if worsening pain, might need CT; not needed at this point).  Spoke to pt's wife at 5:55, with update (photo) at 7:15 pm

## 2015-06-21 NOTE — Telephone Encounter (Signed)
Pt states his wife is coming over to drop off stone and I have put future orders in for this to be release when they come to drop it off

## 2015-06-22 ENCOUNTER — Other Ambulatory Visit: Payer: BLUE CROSS/BLUE SHIELD

## 2015-06-22 DIAGNOSIS — N200 Calculus of kidney: Secondary | ICD-10-CM

## 2015-06-28 LAB — STONE ANALYSIS: STONE WEIGHT KSTONE: 0.001 g

## 2016-04-19 ENCOUNTER — Encounter: Payer: Self-pay | Admitting: Family Medicine

## 2016-04-19 NOTE — Progress Notes (Signed)
Chief Complaint  Patient presents with  . Annual Exam    fasting annual exam. No concerns.    Gregory Wright is a 45 y.o. male who presents for a complete physical.  He has no complaints today.  Impaired fasting glucose:  Last year his A1c was 5.9.  We always see him for his physical about a week or so after his vacation to [facility], where he admits to drinking more alcohol than usual.  Today is the same--he just got back. Drank at least 8 beers/d while at the beach (left for business trip for part of the week), about 1/hr while at the beach.  He drinks a regular Coke once daily during the week.  No longer drinks soda at the house. Drinks a lot of water, G2 Gatorade. Eats brown rice instead of white, cut back on candy. He gained 5# since his physical last year, which he relates to his recent vacation.   Immunization History  Administered Date(s) Administered  . Tdap 03/26/2007   Doesn't get flu shots  Last colonoscopy: never  Last PSA: never  Ophtho: yearly, due now Dentist: regularly  Exercise: 5 days/week: basketball (Saturdays), tennis (drills, more cardio than regular doubles), walking with his wife 4-5 days/week. mountain biking (not recently) goes to gym 2x/week in the winters  Lipids: Lab Results  Component Value Date   CHOL 204 (H) 04/21/2015   HDL 55 04/21/2015   LDLCALC 138 (H) 04/21/2015   TRIG 56 04/21/2015   CHOLHDL 3.7 04/21/2015    Past Medical History:  Diagnosis Date  . Allergy    seasonal  . Anxiety state, unspecified 2006   work-related, resolved  . Hyperlipidemia    mild  . Kidney stone 05/2015   calcium oxalate    Past Surgical History:  Procedure Laterality Date  . INGUINAL HERNIA REPAIR  1991   left  . VASECTOMY  6/05    Social History   Social History  . Marital status: Married    Spouse name: Eulus Quintero  . Number of children: 2  . Years of education: N/A   Occupational History  . finance and operations/process efficiency   .   [employer]   Social History Main Topics  . Smoking status: Never Smoker  . Smokeless tobacco: Never Used  . Alcohol use 0.0 oz/week     Comment: 1-2 beers a week, 3-4 over the weekends in the summer (8/day while on beach vacation)  . Drug use: No  . Sexual activity: Yes    Birth control/ protection: Surgical     Comment: vasectomy   Other Topics Concern  . Not on file   Social History Narrative   Married, 2 sons, 2 dogs. Promoted 01/2015--runs [employer] facility.  Work 1 day/week late (and 1 day/month stays til 1 am)    Family History  Problem Relation Age of Onset  . Hypertension Father   . Graves' disease Father   . Cancer Cousin 37    lung (farmer, nonsmoker)--1st cousin  . Cancer Maternal Aunt     spread to lungs, ?primary; also had brain tumor  . Diabetes Paternal Grandmother     No outpatient encounter prescriptions on file as of 04/23/2016.   No facility-administered encounter medications on file as of 04/23/2016.     No Known Allergies   ROS: The patient denies anorexia, fever, headaches, vision loss, decreased hearing, ear pain, hoarseness, chest pain, palpitations, dizziness, syncope, dyspnea on exertion, cough, swelling, nausea, vomiting, diarrhea,  constipation, abdominal pain, melena, hematochezia, indigestion/heartburn, hematuria, incontinence, erectile dysfunction, nocturia (sometimes up 2-4x, depending on how much liquids he drank), weakened urine stream, dysuria, joint pains, numbness, tingling, weakness, tremor, suspicious skin lesions, depression, anxiety, abnormal bleeding/bruising, or enlarged lymph nodes  Seasonal allergies in spring/fall, no problems now. Uses claritin prn with good results (mostly when pollen is bad)  Occasional sharp/short-lived back pain with "lightening bolts" to the legs. Unchanged, same as the last 10 years + (weekly). Occasional back stiffness in the mornings. Related to weather changes, feels it when playing  golf sometimes. Denies any changes to scrotal masses (unchanged/nontender/present s/p vasectomy)    PHYSICAL EXAM:   BP 128/86   Pulse 68   Ht 6' 2.5" (1.892 m)   Wt 221 lb 9.6 oz (100.5 kg)   BMI 28.07 kg/m  116/80 on repeat by MD  General Appearance:  Alert, cooperative, no distress, appears stated age   Head:  Normocephalic, without obvious abnormality, atraumatic   Eyes:  PERRL, conjunctiva/corneas clear, EOM's intact, fundi  benign   Ears:  Normal TM's and external ear canals   Nose:  Nares normal, mucosa normal, no drainage or sinus tenderness   Throat:  Lips, mucosa, and tongue normal; teeth and gums normal   Neck:  Supple, no lymphadenopathy; thyroid: no enlargement/tenderness/nodules; no carotid  bruit or JVD   Back:  Spine nontender, no curvature, ROM normal, no CVA tenderness   Lungs:  Clear to auscultation bilaterally without wheezes, rales or ronchi; respirations unlabored   Chest Wall:  No tenderness or deformity   Heart:  Regular rate and rhythm, S1 and S2 normal, no murmur, rub  or gallop   Breast Exam:  No chest wall tenderness, masses or gynecomastia   Abdomen:  Soft, non-tender, nondistended, normoactive bowel sounds,  no masses, no hepatosplenomegaly   Genitalia:  Normal male external genitalia without lesions. Testicles without masses. No inguinal hernias.There are nontender, mobile cysts infero-posterior to the testicles bilaterally. No inguinal lymphadenopathy  Rectal:  Normal sphincter tone, no masses or tenderness; guaiac negative stool. Prostate smooth, no nodules, not enlarged.   Extremities:  No clubbing, cyanosis or edema   Pulses:  2+ and symmetric all extremities   Skin:  Skin color, texture, turgor normal, no rashes or lesions   Lymph nodes:  Cervical, supraclavicular, and axillary nodes normal   Neurologic:  CNII-XII intact, normal strength, sensation and gait; reflexes 2+ and symmetric throughout     Psych: Normal mood, affect, hygiene and grooming    ASSESSMENT/PLAN:  Annual physical exam - Plan: Lipid panel, HgB A1c, Glucose, random  Impaired fasting glucose - Plan: HgB A1c, Glucose, random  Pure hypercholesterolemia - Plan: Lipid panel   Recommended at least 30 minutes of aerobic activity at least 5 days/week, weight-bearing exercise 2x/week; Monthly self-testicular exams. Immunization recommendations are up to date, flu shots recommended each Fall. Td next year. Colonoscopy age 3.

## 2016-04-23 ENCOUNTER — Encounter: Payer: Self-pay | Admitting: Family Medicine

## 2016-04-23 ENCOUNTER — Ambulatory Visit (INDEPENDENT_AMBULATORY_CARE_PROVIDER_SITE_OTHER): Payer: BLUE CROSS/BLUE SHIELD | Admitting: Family Medicine

## 2016-04-23 VITALS — BP 118/80 | HR 68 | Ht 74.5 in | Wt 221.6 lb

## 2016-04-23 DIAGNOSIS — E7800 Pure hypercholesterolemia, unspecified: Secondary | ICD-10-CM | POA: Diagnosis not present

## 2016-04-23 DIAGNOSIS — Z0000 Encounter for general adult medical examination without abnormal findings: Secondary | ICD-10-CM

## 2016-04-23 DIAGNOSIS — R7301 Impaired fasting glucose: Secondary | ICD-10-CM | POA: Diagnosis not present

## 2016-04-23 LAB — POCT URINALYSIS DIPSTICK
Bilirubin, UA: NEGATIVE
Blood, UA: NEGATIVE
Glucose, UA: NEGATIVE
KETONES UA: NEGATIVE
LEUKOCYTES UA: NEGATIVE
Nitrite, UA: NEGATIVE
PH UA: 6
PROTEIN UA: NEGATIVE
UROBILINOGEN UA: NEGATIVE

## 2016-04-23 NOTE — Patient Instructions (Signed)

## 2016-04-24 LAB — LIPID PANEL
CHOL/HDL RATIO: 2.8 ratio (ref <=5.0)
Cholesterol: 216 mg/dL — ABNORMAL HIGH (ref 125–200)
HDL: 76 mg/dL (ref >=40)
LDL Cholesterol: 125 mg/dL (ref <130)
Triglycerides: 73 mg/dL (ref <150)
VLDL: 15 mg/dL (ref <30)

## 2016-04-24 LAB — HEMOGLOBIN A1C
HEMOGLOBIN A1C: 5.9 % — AB (ref <5.7)
Mean Plasma Glucose: 123 mg/dL

## 2016-04-24 LAB — GLUCOSE, RANDOM: GLUCOSE: 105 mg/dL — AB (ref 65–99)

## 2016-08-06 ENCOUNTER — Encounter: Payer: Self-pay | Admitting: Family Medicine

## 2016-08-06 ENCOUNTER — Ambulatory Visit (INDEPENDENT_AMBULATORY_CARE_PROVIDER_SITE_OTHER): Payer: BLUE CROSS/BLUE SHIELD | Admitting: Family Medicine

## 2016-08-06 VITALS — BP 128/80 | HR 80 | Ht 74.5 in | Wt 221.0 lb

## 2016-08-06 DIAGNOSIS — R42 Dizziness and giddiness: Secondary | ICD-10-CM

## 2016-08-06 LAB — POCT URINALYSIS DIPSTICK
Bilirubin, UA: NEGATIVE
Blood, UA: NEGATIVE
GLUCOSE UA: NEGATIVE
Ketones, UA: NEGATIVE
Leukocytes, UA: NEGATIVE
NITRITE UA: NEGATIVE
Protein, UA: NEGATIVE
UROBILINOGEN UA: NEGATIVE
pH, UA: 6

## 2016-08-06 NOTE — Patient Instructions (Signed)
  Please be sure to drink at least 6-8 glasses of water each day--an extra glass for every alcoholic or caffeinated beverage that you drink, and extra water when exercising.  Your urine was concentrated today (in the middle of the afternoon, without any exercise), meaning that you need to drink more water daily.  It may be related to dehydration that you're feeling bad when exerting yourself (especially since it occurs at least an hour into exercising, not right away).  It could also be related to nutrition/sugar.  Be sure to have a snack in your bag if/when feeling bad, to eat along with drinking more water.   I recommend having water bottle at the gym, rather than using the water fountain--it is easier to drink larger amounts (and to quantitate how much you are drinking).  If this occurs again, please try and count your pulse (count the pulse for 15 seconds and multiply x 4).  It may be that you're pushing too hard, in combination with being dehydrated. If you find that you're having episodes of palpitations (extra or irregular beats), or beating hard or fast for no reason, please let me know.  If you develop any chest pressure or shortness of breath with exertion, please let me know--we will refer you for further heart testing.  I honestly feel like this is likely related to fluids (and/or fuel), to be more mindful of this, drink more water/juice and let me know if symptoms persist or worsen despite following these recommendations.

## 2016-08-06 NOTE — Progress Notes (Signed)
Chief Complaint  Patient presents with  . Dizziness    for the last 3-4 weeks. Happens during basketball. Or during exercise, or when he is pushing himself, towards the end of the game. Not at all in everyday life.    Tennis, basketball, mountain biking. An hour into playing basketball on Saturdays--gets LH, feels hazy, "off-kilter".  Lasts 15 seconds, then was fine.  3 weeks ago, after 2 hours of playing--thought he might pass out.  Things were fuzzy, "off".  No associated SOB, tachycardia or palpitations.  Lasted about 45 seconds. Felt normal within 5 minutes of resting, drinking some water.  Eats light before exercise--nutrigrain bar, or banana.  This past Saturday, 1 hour in, felt the same, not as bad.  Had 30sec to regroup and was able to continue playing.  Generally only has 1-2 beers on Friday with dinner.  Mostly occurs with basketball, rarely with tennis (only if very hot).  Occasionally occurs during a long mountain bike ride (not recent).  Typical day, not exercising--2 glasses of water, 1 soda/d (usually sugar-free), 1 juice (orange juice or power-ade). With exercise--1-2 extra glasses/d of water and/or power-ade  BP 110/80 (BP Location: Right Arm, Patient Position: Supine, Cuff Size: Normal)   Pulse 64   Ht 6' 2.5" (1.892 m)   Wt 221 lb (100.2 kg)   BMI 28.00 kg/m   Orthostatic VS: Laying 110/80 P 64 Sitting 120/80 P 64 Standing 128/80 P 80  Well appearing, pleasant male in no distress HEENT: PERRL, EOMI, conjunctiva and sclera are clear, OP clear Neck: no lymphadenopathy, thyromegaly or bruit Heart: regular rate and rhythm without murmur, rub, gallop Lungs: clear bilaterally Abdomen: soft, nontender, no mass Extremities: no edema, normal pulse Psych: normal mood, affect, hygiene and grooming Neuro: alert and oriented, cranial nerves intact. Normal strength, gait  Urine dip:  SG 1.030 EKG:  Rate 58; incomplete RBBB (very small RR' in V2, broad S wave in V5 and  V6; QRS 102ms). No acute findings.   ASSESSMENT/PLAN:  Episodic lightheadedness - suspect related to dehydration, possibly inadequate nutrition with exercise also. Doubt cardiac. contact us if persists despite adequate fluid/nutrition - Plan: POCT Urinalysis Dipstick, EKG 12-Lead    Please be sure to drink at least 6-8 glasses of water each day--an extra glass for every alcoholic or caffeinated beverage that you drink, and extra water when exercising.  Your urine was concentrated today (in the middle of the afternoon, without any exercise), meaning that you need to drink more water daily.  It may be related to dehydration that you're feeling bad when exerting yourself (especially since it occurs at least an hour into exercising, not right away).  It could also be related to nutrition/sugar.  Be sure to have a snack in your bag if/when feeling bad, to eat along with drinking more water.   I recommend having water bottle at the gym, rather than using the water fountain--it is easier to drink larger amounts (and to quantitate how much you are drinking).  If this occurs again, please try and count your pulse (count the pulse for 15 seconds and multiply x 4).  It may be that you're pushing too hard, in combination with being dehydrated. If you find that you're having episodes of palpitations (extra or irregular beats), or beating hard or fast for no reason, please let me know.  If you develop any chest pressure or shortness of breath with exertion, please let me know--we will refer you for further heart testing.  I honestly feel like this is likely related to fluids (and/or fuel), to be more mindful of this, drink more water/juice and let me know if symptoms persist or worsen despite following these recommendations.

## 2016-11-21 DIAGNOSIS — M9905 Segmental and somatic dysfunction of pelvic region: Secondary | ICD-10-CM | POA: Diagnosis not present

## 2016-11-21 DIAGNOSIS — M9904 Segmental and somatic dysfunction of sacral region: Secondary | ICD-10-CM | POA: Diagnosis not present

## 2016-11-21 DIAGNOSIS — M5127 Other intervertebral disc displacement, lumbosacral region: Secondary | ICD-10-CM | POA: Diagnosis not present

## 2016-11-21 DIAGNOSIS — M9903 Segmental and somatic dysfunction of lumbar region: Secondary | ICD-10-CM | POA: Diagnosis not present

## 2016-11-23 DIAGNOSIS — M9905 Segmental and somatic dysfunction of pelvic region: Secondary | ICD-10-CM | POA: Diagnosis not present

## 2016-11-23 DIAGNOSIS — M9903 Segmental and somatic dysfunction of lumbar region: Secondary | ICD-10-CM | POA: Diagnosis not present

## 2016-11-23 DIAGNOSIS — M9904 Segmental and somatic dysfunction of sacral region: Secondary | ICD-10-CM | POA: Diagnosis not present

## 2016-11-23 DIAGNOSIS — M5127 Other intervertebral disc displacement, lumbosacral region: Secondary | ICD-10-CM | POA: Diagnosis not present

## 2016-11-28 DIAGNOSIS — M9905 Segmental and somatic dysfunction of pelvic region: Secondary | ICD-10-CM | POA: Diagnosis not present

## 2016-11-28 DIAGNOSIS — M5127 Other intervertebral disc displacement, lumbosacral region: Secondary | ICD-10-CM | POA: Diagnosis not present

## 2016-11-28 DIAGNOSIS — M9903 Segmental and somatic dysfunction of lumbar region: Secondary | ICD-10-CM | POA: Diagnosis not present

## 2016-11-28 DIAGNOSIS — M9904 Segmental and somatic dysfunction of sacral region: Secondary | ICD-10-CM | POA: Diagnosis not present

## 2017-04-24 NOTE — Progress Notes (Signed)
Chief Complaint  Patient presents with  . Annual Exam    fasting annual exam, already had eye exam this year. No concerns.    Gregory Wright is a 46 y.o. male who presents for a complete physical.  He has the following concerns:  Seen last in November with complaints of lightheadedness when playing basketball on Saturdays.  This resolved by staying better hydrated.  Drinking 2-3 22oz water bottles throughout the day at work.  Impaired fasting glucose: Last year his A1c was 5.9. We always see him for his physical upon returning from vacation to [facility], where he admits to drinking more alcohol than usual.  Drank at least 6-7 beers/d while at the beach, about 1/hr while at the beach.  He drinks sugar-free Cokes most of the time (regular soda much less often), mostly 1 daily (lunch at work).  No longer drinks regular soda at the house. Drinks a lot of water, G2 Gatorade. Continues to eat brown rice instead of white, cut back on candy.   Immunization History  Administered Date(s) Administered  . Tdap 03/26/2007   Doesn't get flu shots  Last colonoscopy: never  Last PSA: never  Ophtho: yearly Dentist: regularly , 2x/year Exercise: 5 days/week: basketball (Saturdays, 2 hours), tennis (drills, more cardio than regular doubles--less over summer), walking with his wife occasionally. mountain biking (not recently) goes to gym 2x/week   Lipids: Lab Results  Component Value Date   CHOL 216 (H) 04/23/2016   HDL 76 04/23/2016   LDLCALC 125 04/23/2016   TRIG 73 04/23/2016   CHOLHDL 2.8 04/23/2016   He believes he needs lipids checked for insurance/work purposes  Past Medical History:  Diagnosis Date  . Allergy    seasonal  . Anxiety state, unspecified 2006   work-related, resolved  . Hyperlipidemia    mild  . Kidney stone 05/2015   calcium oxalate    Past Surgical History:  Procedure Laterality Date  . INGUINAL HERNIA REPAIR  1991   left  . VASECTOMY  6/05    Social  History   Social History  . Marital status: Married    Spouse name: Jaymz Traywick  . Number of children: 2  . Years of education: N/A   Occupational History  . finance and operations/process efficiency   .  [employer]   Social History Main Topics  . Smoking status: Never Smoker  . Smokeless tobacco: Never Used  . Alcohol use 0.0 oz/week     Comment: 3-4 over the weekends in the summer (6-7/day while on beach vacation)  . Drug use: No  . Sexual activity: Yes    Birth control/ protection: Surgical     Comment: vasectomy   Other Topics Concern  . Not on file   Social History Narrative   Married, 2 sons, 2 dogs. Promoted 01/2015--runs [employer] facility.  Work 1 day/week late (and 1 day/month stays til 1 am)    Family History  Problem Relation Age of Onset  . Hypertension Father   . Graves' disease Father   . Cancer Cousin 37       lung (farmer, nonsmoker)--1st cousin  . Cancer Maternal Aunt        spread to lungs, ?primary; also had brain tumor  . Diabetes Paternal Grandmother     No outpatient encounter prescriptions on file as of 04/25/2017.   No facility-administered encounter medications on file as of 04/25/2017.     No Known Allergies  ROS: The patient denies anorexia,  fever, headaches, vision loss, decreased hearing, ear pain, hoarseness, chest pain, palpitations, dizziness, syncope, dyspnea on exertion, cough, swelling, nausea, vomiting, diarrhea, constipation, abdominal pain, melena, hematochezia, indigestion/heartburn, hematuria, incontinence, erectile dysfunction, nocturia (sometimes up 2-4x, depending on how much liquids he drank), weakened urine stream, dysuria, joint pains, numbness, tingling, weakness, tremor, suspicious skin lesions, depression, anxiety, abnormal bleeding/bruising, or enlarged lymph nodes  Feels "natural tired", from work, energy seems okay on the weekend. Rare hearburn with spicy/tomato-based foods. Seasonal allergies in  spring/fall, no problems now. Uses claritin prn with good results (mostly when pollen is bad)  Occasional sharp/short-lived back pain with "lightening bolts" to the legs. Unchanged, same as the last 10 years + (weekly). Occasional back stiffness in the mornings. Related to weather changes, feels it when playing golf sometimes. Had increased pain in Feb/March 2018, saw chiro Clydie Braun(Karen at [facility])--still doing HEP, which helps. Denies any changes to scrotal masses (unchanged/nontender/present s/p vasectomy) No further lightheadedness with basketball  PHYSICAL EXAM:  BP 100/70 (BP Location: Left Arm, Patient Position: Sitting, Cuff Size: Normal)   Pulse 68   Ht 6' 2.5" (1.892 m)   Wt 219 lb 6.4 oz (99.5 kg)   BMI 27.79 kg/m   Wt Readings from Last 3 Encounters:  04/25/17 219 lb 6.4 oz (99.5 kg)  08/06/16 221 lb (100.2 kg)  04/23/16 221 lb 9.6 oz (100.5 kg)    General Appearance:  Alert, cooperative, no distress, appears stated age   Head:  Normocephalic, without obvious abnormality, atraumatic   Eyes:  PERRL, conjunctiva/corneas clear, EOM's intact, fundi  benign   Ears:  Normal TM's and external ear canals   Nose:  Nares normal, mucosa normal, no drainage or sinus tenderness   Throat:  Lips, mucosa, and tongue normal; teeth and gums normal   Neck:  Supple, no lymphadenopathy; thyroid: no enlargement/tenderness/nodules; no carotid  bruit or JVD   Back:  Spine nontender, no curvature, ROM normal, no CVA tenderness   Lungs:  Clear to auscultation bilaterally without wheezes, rales or ronchi; respirations unlabored   Chest Wall:  No tenderness or deformity   Heart:  Regular rate and rhythm, S1 and S2 normal, no murmur, rub  or gallop   Breast Exam:  No chest wall tenderness, masses or gynecomastia   Abdomen:  Soft, non-tender, nondistended, normoactive bowel sounds,  no masses, no hepatosplenomegaly   Genitalia:  Normal male external genitalia without lesions.  Testicles without masses. No inguinal hernias.There are nontender, mobile cysts infero-posterior to the testicle on the right. No inguinal lymphadenopathy  Rectal:  Normal sphincter tone, no masses or tenderness; guaiac negative stool. Prostate smooth, no nodules, not enlarged.   Extremities:  No clubbing, cyanosis or edema   Pulses:  2+ and symmetric all extremities   Skin:  Skin color, texture, turgor normal, no rashes or lesions   Lymph nodes:  Cervical, supraclavicular, and axillary nodes normal   Neurologic:  CNII-XII intact, normal strength, sensation and gait; reflexes 2+ and symmetric throughout    Psych:  Normal mood, affect, hygiene and grooming    ASSESSMENT/PLAN:   Annual physical exam - Plan: POCT Urinalysis DIP (Proadvantage Device), Td : Tetanus/diphtheria >7yo Preservative  free, Lipid panel, Comprehensive metabolic panel, CBC with Differential/Platelet, VITAMIN D 25 Hydroxy (Vit-D Deficiency, Fractures), TSH  Impaired fasting glucose - encouraged proper diet, regular exercise - Plan: Hemoglobin A1c  Need for tetanus booster - Plan: Td : Tetanus/diphtheria >7yo Preservative  free  Other fatigue - Plan: Comprehensive metabolic panel, CBC with  Differential/Platelet, VITAMIN D 25 Hydroxy (Vit-D Deficiency, Fractures), TSH    Recommended at least 30 minutes of aerobic activity at least 5 days/week, weight-bearing exercise 2x/week; Monthly self-testicular exams. Immunization recommendations discussed-- flu shots recommended each Fall (declines). Td booster today.  Shingrix age 46. Colonoscopy age 46.   F/u 1 year, sooner prn

## 2017-04-25 ENCOUNTER — Ambulatory Visit (INDEPENDENT_AMBULATORY_CARE_PROVIDER_SITE_OTHER): Payer: BLUE CROSS/BLUE SHIELD | Admitting: Family Medicine

## 2017-04-25 ENCOUNTER — Encounter: Payer: Self-pay | Admitting: Family Medicine

## 2017-04-25 VITALS — BP 100/70 | HR 68 | Ht 74.5 in | Wt 219.4 lb

## 2017-04-25 DIAGNOSIS — Z23 Encounter for immunization: Secondary | ICD-10-CM

## 2017-04-25 DIAGNOSIS — Z0000 Encounter for general adult medical examination without abnormal findings: Secondary | ICD-10-CM | POA: Diagnosis not present

## 2017-04-25 DIAGNOSIS — R7301 Impaired fasting glucose: Secondary | ICD-10-CM

## 2017-04-25 DIAGNOSIS — R5383 Other fatigue: Secondary | ICD-10-CM | POA: Diagnosis not present

## 2017-04-25 LAB — POCT URINALYSIS DIP (PROADVANTAGE DEVICE)
BILIRUBIN UA: NEGATIVE
BILIRUBIN UA: NEGATIVE mg/dL
Blood, UA: NEGATIVE
Glucose, UA: NEGATIVE mg/dL
Leukocytes, UA: NEGATIVE
Nitrite, UA: NEGATIVE
SPECIFIC GRAVITY, URINE: 1.03
Urobilinogen, Ur: NEGATIVE
pH, UA: 6 (ref 5.0–8.0)

## 2017-04-25 LAB — CBC WITH DIFFERENTIAL/PLATELET
BASOS PCT: 0 %
Basophils Absolute: 0 cells/uL (ref 0–200)
EOS ABS: 61 {cells}/uL (ref 15–500)
Eosinophils Relative: 1 %
HEMATOCRIT: 41.8 % (ref 38.5–50.0)
HEMOGLOBIN: 14.2 g/dL (ref 13.2–17.1)
LYMPHS ABS: 1403 {cells}/uL (ref 850–3900)
Lymphocytes Relative: 23 %
MCH: 32.5 pg (ref 27.0–33.0)
MCHC: 34 g/dL (ref 32.0–36.0)
MCV: 95.7 fL (ref 80.0–100.0)
MONO ABS: 427 {cells}/uL (ref 200–950)
MPV: 9.3 fL (ref 7.5–12.5)
Monocytes Relative: 7 %
NEUTROS ABS: 4209 {cells}/uL (ref 1500–7800)
Neutrophils Relative %: 69 %
Platelets: 249 10*3/uL (ref 140–400)
RBC: 4.37 MIL/uL (ref 4.20–5.80)
RDW: 13.6 % (ref 11.0–15.0)
WBC: 6.1 10*3/uL (ref 4.0–10.5)

## 2017-04-25 LAB — COMPREHENSIVE METABOLIC PANEL
ALBUMIN: 4.4 g/dL (ref 3.6–5.1)
ALT: 28 U/L (ref 9–46)
AST: 21 U/L (ref 10–40)
Alkaline Phosphatase: 77 U/L (ref 40–115)
BUN: 20 mg/dL (ref 7–25)
CHLORIDE: 103 mmol/L (ref 98–110)
CO2: 21 mmol/L (ref 20–31)
Calcium: 9.7 mg/dL (ref 8.6–10.3)
Creat: 1.3 mg/dL (ref 0.60–1.35)
Glucose, Bld: 99 mg/dL (ref 65–99)
POTASSIUM: 4.4 mmol/L (ref 3.5–5.3)
Sodium: 137 mmol/L (ref 135–146)
TOTAL PROTEIN: 7.1 g/dL (ref 6.1–8.1)
Total Bilirubin: 0.7 mg/dL (ref 0.2–1.2)

## 2017-04-25 LAB — LIPID PANEL
CHOL/HDL RATIO: 3.7 ratio (ref <5.0)
Cholesterol: 225 mg/dL — ABNORMAL HIGH (ref <200)
HDL: 61 mg/dL (ref >40)
LDL Cholesterol: 150 mg/dL — ABNORMAL HIGH (ref <100)
TRIGLYCERIDES: 69 mg/dL (ref <150)
VLDL: 14 mg/dL (ref <30)

## 2017-04-25 LAB — TSH: TSH: 1.66 m[IU]/L (ref 0.40–4.50)

## 2017-04-25 NOTE — Patient Instructions (Signed)

## 2017-04-26 LAB — HEMOGLOBIN A1C
HEMOGLOBIN A1C: 5.6 % (ref <5.7)
MEAN PLASMA GLUCOSE: 114 mg/dL

## 2017-04-26 LAB — VITAMIN D 25 HYDROXY (VIT D DEFICIENCY, FRACTURES): Vit D, 25-Hydroxy: 33 ng/mL (ref 30–100)

## 2017-05-01 ENCOUNTER — Telehealth: Payer: Self-pay | Admitting: Family Medicine

## 2017-05-01 NOTE — Telephone Encounter (Signed)
Pt dropped of a form that needs to be filled out states that it can be emailed back to him [email or web address] and call him at  [phone] to let him know when it was sent to him, put in your folder,

## 2017-05-02 NOTE — Telephone Encounter (Signed)
FFO

## 2017-06-27 ENCOUNTER — Ambulatory Visit (INDEPENDENT_AMBULATORY_CARE_PROVIDER_SITE_OTHER): Payer: BLUE CROSS/BLUE SHIELD | Admitting: Family Medicine

## 2017-06-27 ENCOUNTER — Encounter: Payer: Self-pay | Admitting: Family Medicine

## 2017-06-27 VITALS — BP 130/70 | HR 64 | Ht 74.5 in | Wt 219.0 lb

## 2017-06-27 DIAGNOSIS — J309 Allergic rhinitis, unspecified: Secondary | ICD-10-CM

## 2017-06-27 DIAGNOSIS — R400 Somnolence: Secondary | ICD-10-CM

## 2017-06-27 DIAGNOSIS — G478 Other sleep disorders: Secondary | ICD-10-CM

## 2017-06-27 DIAGNOSIS — R0683 Snoring: Secondary | ICD-10-CM | POA: Diagnosis not present

## 2017-06-27 NOTE — Patient Instructions (Signed)
We are referring you for a sleep study--not sure if it will end up being at the sleep lab or a home study (depends on your insurance).    Increase your flonase to 2 sprays each nostril every day, and try just gentle sniffs. Nasal congestion can lead to mouth breathing and increased snoring.  If there is no sleep apnea found, then we need to work on the snoring (Breathe-right strips, referral to an ENT if needed).  If there is sleep apnea found, that is significant, we will start with CPAP treatment.
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
## 2017-08-08 ENCOUNTER — Ambulatory Visit (HOSPITAL_BASED_OUTPATIENT_CLINIC_OR_DEPARTMENT_OTHER): Payer: BLUE CROSS/BLUE SHIELD | Attending: Family Medicine | Admitting: Internal Medicine

## 2017-08-08 VITALS — Ht 75.0 in | Wt 219.0 lb

## 2017-08-08 DIAGNOSIS — R0683 Snoring: Secondary | ICD-10-CM | POA: Diagnosis not present

## 2017-08-08 DIAGNOSIS — G478 Other sleep disorders: Secondary | ICD-10-CM

## 2017-08-08 DIAGNOSIS — G4710 Hypersomnia, unspecified: Secondary | ICD-10-CM | POA: Diagnosis not present

## 2017-08-08 DIAGNOSIS — G4733 Obstructive sleep apnea (adult) (pediatric): Secondary | ICD-10-CM | POA: Insufficient documentation

## 2017-08-08 DIAGNOSIS — R400 Somnolence: Secondary | ICD-10-CM

## 2017-08-24 DIAGNOSIS — R0683 Snoring: Secondary | ICD-10-CM | POA: Diagnosis not present

## 2017-08-24 NOTE — Procedures (Signed)
Patient Name: Gregory Wright, Jacek Study Date: 08/08/2017 Gender: Male D.O.B: [birth year] Age (years): 46 Referring Provider: Joselyn ArrowEve Knapp Height (inches): 73 Interpreting Physician: Jetty Duhamellinton Paisyn Guercio MD, ABSM Weight (lbs): 215 RPSGT: Cherylann ParrDubili, Fred BMI: 28 MRN: [id number] Neck Size: 17.00 CLINICAL INFORMATION Sleep Study Type: NPSG  Indication for sleep study: Excessive Daytime Sleepiness, Fatigue, Snoring, Witnessed Apneas  Epworth Sleepiness Score: 6  SLEEP STUDY TECHNIQUE As per the AASM Manual for the Scoring of Sleep and Associated Events v2.3 (April 2016) with a hypopnea requiring 4% desaturations.  The channels recorded and monitored were frontal, central and occipital EEG, electrooculogram (EOG), submentalis EMG (chin), nasal and oral airflow, thoracic and abdominal wall motion, anterior tibialis EMG, snore microphone, electrocardiogram, and pulse oximetry.  MEDICATIONS Medications self-administered by patient taken the night of the study : none reported  SLEEP ARCHITECTURE The study was initiated at 10:15:38 PM and ended at 4:43:53 AM.  Sleep onset time was 32.2 minutes and the sleep efficiency was 73.4%. The total sleep time was 285.0 minutes.  Stage REM latency was 85.0 minutes.  The patient spent 6.14% of the night in stage N1 sleep, 69.30% in stage N2 sleep, 0.00% in stage N3 and 24.56% in REM.  Alpha intrusion was absent.  Supine sleep was 2.28%.  RESPIRATORY PARAMETERS The overall apnea/hypopnea index (AHI) was 7.6 per hour. There were 36 total apneas, including 27 obstructive, 9 central and 0 mixed apneas. There were 0 hypopneas and 0 RERAs.  The AHI during Stage REM sleep was 12.0 per hour.  AHI while supine was 46.2 per hour.  The mean oxygen saturation was 95.45%. The minimum SpO2 during sleep was 91.00%.  loud snoring was noted during this study.  CARDIAC DATA The 2 lead EKG demonstrated sinus rhythm. The mean heart rate was 58.60 beats per minute. Other EKG  findings include: None  LEG MOVEMENT DATA The total PLMS were 0 with a resulting PLMS index of 0.00. Associated arousal with leg movement index was 0.0 .  IMPRESSIONS - Mild obstructive sleep apnea occurred during this study (AHI = 7.6/h). - There were insufficient early events and sleep to meet protocol requirements for split CPAP titration. - No significant central sleep apnea occurred during this study (CAI = 1.9/h). - The patient had minimal or no oxygen desaturation during the study (Min O2 = 91.00%) - The patient snored with loud snoring volume. - No cardiac abnormalities were noted during this study. - Clinically significant periodic limb movements did not occur during sleep. No significant associated arousals.  DIAGNOSIS - Obstructive Sleep Apnea (327.23 [G47.33 ICD-10]  RECOMMENDATIONS - Treatment for very mild OSA may be appropriate due to disruptive snoring. CPAP, fitted oral appliance, or management for nasal airway obstruction, including ENT evaluation, might all be considered, based on clinical judgment. - Positional therapy avoiding supine position during sleep. - Avoid alcohol, sedatives and other CNS depressants that may worsen sleep apnea and disrupt normal sleep architecture. - Sleep hygiene should be reviewed to assess factors that may improve sleep quality. - Weight management and regular exercise should be initiated or continued if appropriate.  [Electronically signed] 08/24/2017 09:50 AM  Jetty Duhamellinton Addi Pak MD, ABSM Diplomate, American Board of Sleep Medicine   NPI: [id number]                          Jetty Duhamellinton Lilyauna Miedema Diplomate, American Board of Sleep Medicine  ELECTRONICALLY SIGNED ON:  08/24/2017, 9:46 AM [facility] PH: [phone]  FX: [phone] [place]

## 2018-04-02 DIAGNOSIS — L821 Other seborrheic keratosis: Secondary | ICD-10-CM | POA: Diagnosis not present

## 2018-04-02 DIAGNOSIS — L570 Actinic keratosis: Secondary | ICD-10-CM | POA: Diagnosis not present

## 2018-04-02 DIAGNOSIS — D2262 Melanocytic nevi of left upper limb, including shoulder: Secondary | ICD-10-CM | POA: Diagnosis not present

## 2018-04-02 DIAGNOSIS — L820 Inflamed seborrheic keratosis: Secondary | ICD-10-CM | POA: Diagnosis not present

## 2018-04-02 DIAGNOSIS — D225 Melanocytic nevi of trunk: Secondary | ICD-10-CM | POA: Diagnosis not present

## 2018-04-02 DIAGNOSIS — D2362 Other benign neoplasm of skin of left upper limb, including shoulder: Secondary | ICD-10-CM | POA: Diagnosis not present

## 2018-05-06 DIAGNOSIS — G4733 Obstructive sleep apnea (adult) (pediatric): Secondary | ICD-10-CM | POA: Insufficient documentation

## 2018-05-06 NOTE — Progress Notes (Signed)
Chief Complaint  Patient presents with  . Annual Exam    fasting annual exam. Has had a little bit of a cough over the last few weeks. Has been a bit fatigued lately as well.     Gregory Wright is a 47 y.o. male who presents for a complete physical.  He has the following concerns:  He has been complaining of some cough and fatigue over the last few weeks. He had a lot of PND at night while [place].  He had been using Flonase, and added Xyzal, which helped.  Slight persistent cough which he feels in his throat, not the chest, during the day.  The drainage at night is significantly better. He feels the fatigue is related to work/stress (son going to college, mother-in-law sick, [place] to [place] [facility] lot).  Dad and uncle had strokes this summer. He feels refreshed when he wakes up. He does admit to significant alcohol intake while on vacation, as per usual (just one week/year), but increased quantity this year, up to 10/day, drinking about a beer an hour.  Never felt drunk, still rode bikes, very active, great stress relief with his family.  Pt had sleep study in November, due to unrefreshed sleep and snoring.  Sleep was at its worst at that time, when he had a lot of work stress. He feels like his sleep has improved.  Still snores, some nights worse than other per wife. "It has been a lot better".  IMPRESSIONS - Mild obstructive sleep apnea occurred during this study (AHI =  7.6/h). - There were insufficient early events and sleep to meet protocol  requirements for split CPAP titration. - No significant central sleep apnea occurred during this study  (CAI = 1.9/h). - The patient had minimal or no oxygen desaturation during the  study (Min O2 = 91.00%) - The patient snored with loud snoring volume. - No cardiac abnormalities were noted during this study. - Clinically significant periodic limb movements did not occur  during sleep. No significant associated  arousals.  DIAGNOSIS - Obstructive Sleep Apnea (327.23 [G47.33 ICD-10]  RECOMMENDATIONS - Treatment for very mild OSA may be appropriate due to  disruptive snoring. CPAP, fitted oral appliance, or management  for nasal airway obstruction, including ENT evaluation, might all  be considered, based on clinical judgment. - Positional therapy avoiding supine position during sleep. - Avoid alcohol, sedatives and other CNS depressants that may  worsen sleep apnea and disrupt normal sleep architecture. - Sleep hygiene should be reviewed to assess factors that may  improve sleep quality. - Weight management and regular exercise should be initiated or  continued if appropriate.  Hypercholesterolemia:  His cholesterol/LDL was noted to be much higher last year compared to previously.  He had just returned from beach vacation.  This year he delayed his physical by a few weeks.  +cheese, red meat up to 2x/week, mostly chicken/fish, 2 eggs/week.  He is fasting for repeat labs today. Last lipids: Lab Results  Component Value Date   CHOL 225 (H) 04/25/2017   HDL 61 04/25/2017   LDLCALC 150 (H) 04/25/2017   TRIG 69 04/25/2017   CHOLHDL 3.7 04/25/2017   In 2017: Cholesterol 125 - 200 mg/dL [id number]    Triglycerides <150 mg/dL 73   HDL >=13>=40 mg/dL 76   Total CHOL/HDL Ratio <=5.0 Ratio 2.8   VLDL <30 mg/dL 15   LDL Cholesterol <086<130 mg/dL 578125    Previously had problems with lightheadedness when  playing basketball, resolved by staying better hydrated.   Impaired fasting glucose: A1c was up to 5.9 in 2016 and 2017, down to 5.6 last year. He stopped drinking regular soda at home (has regular if at restaurant), usually sugar-free Cokes. Continues to drink a lot of water, G2 Gatorade.  Continues to eat brown rice instead of white, and tries to limit candy. He likes Kathlene [employer], sometimes eats while driving.  As reported above, significant beer intake over recent vacation (up to 10/d) Lab Results   Component Value Date   HGBA1C 5.6 04/25/2017     Immunization History  Administered Date(s) Administered  . Td 04/25/2017  . Tdap 03/26/2007   Doesn't get flu shots (is offered at work) Last colonoscopy: never  Last PSA: never  Ophtho: yearly Dentist: regularly, 2x/year Exercise: 5 days/week: basketball (Saturdays, 2 hours), tennis (drills, more cardio than regular doubles--less over summer, starts back up soon), walking with his wife occasionally. Goes to gym 2x/week (cross-fit, weight and running)  Past Medical History:  Diagnosis Date  . Allergy    seasonal  . Anxiety state, unspecified 2006   work-related, resolved  . Hyperlipidemia    mild  . Kidney stone 05/2015   calcium oxalate    Past Surgical History:  Procedure Laterality Date  . INGUINAL HERNIA REPAIR  1991   left  . VASECTOMY  6/05    Social History   Socioeconomic History  . Marital status: Married    Spouse name: Kynan Peasley  . Number of children: 2  . Years of education: Not on file  . Highest education level: Not on file  Occupational History  . Occupation: Geographical information systems officer: [employer]  Social Needs  . Financial resource strain: Not on file  . Food insecurity:    Worry: Not on file    Inability: Not on file  . Transportation needs:    Medical: Not on file    Non-medical: Not on file  Tobacco Use  . Smoking status: Never Smoker  . Smokeless tobacco: Never Used  Substance and Sexual Activity  . Alcohol use: Yes    Alcohol/week: 0.0 standard drinks    Comment: 3-4 over the weekends in the summer (up to 10/day while on recent beach vacation)  . Drug use: No  . Sexual activity: Yes    Birth control/protection: Surgical    Comment: vasectomy  Lifestyle  . Physical activity:    Days per week: Not on file    Minutes per session: Not on file  . Stress: Not on file  Relationships  . Social connections:    Talks on phone: Not on file    Gets  together: Not on file    Attends religious service: Not on file    Active member of club or organization: Not on file    Attends meetings of clubs or organizations: Not on file    Relationship status: Not on file  . Intimate partner violence:    Fear of current or ex partner: Not on file    Emotionally abused: Not on file    Physically abused: Not on file    Forced sexual activity: Not on file  Other Topics Concern  . Not on file  Social History Narrative   Married, 2 sons, 2 dogs. Promoted 01/2015--runs [employer] facility.  Work 1 day/week late (and 1 day/month stays til 1 am), recently more night shift, due to being understaffed.   Son  will be attending [employer] in [place].    Family History  Problem Relation Age of Onset  . Hypertension Father   . Graves' disease Father   . Stroke Father [phone]  . Cancer Cousin 37       lung (farmer, nonsmoker)--1st cousin  . Cancer Maternal Aunt        spread to lungs, ?primary; also had brain tumor  . Diabetes Paternal Grandmother   . Rheum arthritis Cousin   . Stroke Paternal Uncle [id number]    Outpatient Encounter Medications as of 05/07/2018  Medication Sig Note  . fluticasone (FLONASE) 50 MCG/ACT nasal spray Place 2 sprays into both nostrils daily.   Marland Kitchen. levocetirizine (XYZAL) 5 MG tablet Take 5 mg by mouth every evening. 05/07/2018: Takes daily prn for allergies   No facility-administered encounter medications on file as of 05/07/2018.     No Known Allergies   ROS: The patient denies anorexia, fever, headaches, vision loss, decreased hearing, ear pain, hoarseness, chest pain, palpitations, dizziness, syncope, dyspnea on exertion, cough, swelling, nausea, vomiting, diarrhea, constipation, abdominal pain, melena, hematochezia, hematuria, incontinence, erectile dysfunction, nocturia, weakened urine stream, dysuria, joint pains, numbness, tingling, weakness, tremor, suspicious skin lesions, depression, anxiety, abnormal bleeding/bruising, or  enlarged lymph nodes  Rare hearburn with spicy/tomato-based foods. Seasonal allergies in spring/fall, some recently started. Uses xyzal and Flonase seasonally. Occasional sharp/short-lived back pain with "lightening bolts" to the legs. Unchanged, same as the last 10 years +(weekly). Occasional back stiffness in the mornings, which has gotten a little worse. Still doing HEP, and stretching Fatigue per HPI, recent; no unrefreshed sleep.   PHYSICAL EXAM:  BP 100/70   Pulse 60   Temp (!) 96.9 F (36.1 C) (Tympanic)   Ht 6' 2.25" (1.886 m)   Wt 219 lb (99.3 kg)   BMI 27.93 kg/m   Wt Readings from Last 3 Encounters:  05/07/18 219 lb (99.3 kg)  08/08/17 219 lb (99.3 kg)  06/27/17 219 lb (99.3 kg)    General Appearance:  Alert, cooperative, no distress, appears stated age   Head:  Normocephalic, without obvious abnormality, atraumatic   Eyes:  PERRL, conjunctiva/corneas clear, EOM's intact, fundi benign   Ears:  Normal TM's and external ear canals   Nose:  Nares normal, mucosa is mildly edematous, no drainage or sinus tenderness   Throat:  Lips, mucosa, and tongue normal; teeth and gums normal   Neck:  Supple, no lymphadenopathy; thyroid: no enlargement/tenderness/ nodules; no carotid bruit or JVD   Back:  Spine nontender, no curvature, ROM normal, no CVA tenderness   Lungs:  Clear to auscultation bilaterally without wheezes, rales or ronchi; respirations unlabored   Chest Wall:  No tenderness or deformity   Heart:  Regular rate and rhythm, S1 and S2 normal, no murmur, rub or gallop   Breast Exam:  No chest wall tenderness, masses or gynecomastia   Abdomen:  Soft, non-tender, nondistended, normoactive bowel sounds,  no masses, no hepatosplenomegaly   Genitalia:  Normal male external genitalia without lesions. Testicles without masses. No inguinal hernias.There are nontender, mobile cysts infero-posterior to the testicle on the right. No inguinal  lymphadenopathy  Rectal:  Normal sphincter tone, no masses or tenderness; guaiac negative stool. Prostate smooth, no nodules, not enlarged.   Extremities:  No clubbing, cyanosis or edema   Pulses:  2+ and symmetric all extremities   Skin:  Skin color, texture, turgor normal, no rashes or lesions. There is a scabbed/healing area in left upper back (treated by derm,  scab got picked off at beach, reformed, per pt); pink around edges, small central scab. Nontender, no inflammation or drainage.   Lymph nodes:  Cervical, supraclavicular, and axillary nodes normal   Neurologic:  CNII-XII intact, normal strength, sensation and gait; reflexes 2+ and symmetric throughout    Psych:  Normal mood, affect, hygiene and grooming    ASSESSMENT/PLAN:  Annual physical exam - Plan: POCT Urinalysis DIP (Proadvantage Device), Hemoglobin A1c, Lipid panel, Comprehensive metabolic panel  Impaired fasting glucose - Plan: Hemoglobin A1c, Comprehensive metabolic panel  Allergic rhinitis, unspecified seasonality, unspecified trigger  Mild obstructive sleep apnea - no longer symptomatic, just intermittent snoring. If worsening (unrefreshed sleep, daytime somnolence) to discuss with dentist  Pure hypercholesterolemia - lowfat, low cholesterol diet reviewed - Plan: Lipid panel  Engages in binge consumption of alcohol - just once a year while on vacation at beach; higher intake than prev years.  Counseled re: risks, encouraged limiting on future beach trips   Recommended at least 30 minutes of aerobic activity at least 5 days/week, weight-bearing exercise 2x/week; Monthly self-testicular exams.  Immunization recommendations discussed-- flu shots recommended each Fall (declines).  Shingrix age 23.Colonoscopy age 80.   F/u 1 year, sooner prn

## 2018-05-06 NOTE — Patient Instructions (Addendum)
HEALTH MAINTENANCE RECOMMENDATIONS:  It is recommended that you get at least 30 minutes of aerobic exercise at least 5 days/week (for weight loss, you may need as much as 60-90 minutes). This can be any activity that gets your heart rate up. This can be divided in 10-15 minute intervals if needed, but try and build up your endurance at least once a week.  Weight bearing exercise is also recommended twice weekly.  Eat a healthy diet with lots of vegetables, fruits and fiber.  "Colorful" foods have a lot of vitamins (ie green vegetables, tomatoes, red peppers, etc).  Limit sweet tea, regular sodas and alcoholic beverages, all of which has a lot of calories and sugar.  Up to 2 alcoholic drinks daily may be beneficial for men (unless trying to lose weight, watch sugars).  Drink a lot of water.  Sunscreen of at least SPF 30 should be used on all sun-exposed parts of the skin when outside between the hours of 10 am and 4 pm (not just when at beach or pool, but even with exercise, golf, tennis, and yard work!)  Use a sunscreen that says "broad spectrum" so it covers both UVA and UVB rays, and make sure to reapply every 1-2 hours.  Remember to change the batteries in your smoke detectors when changing your clock times in the spring and fall.  Use your seat belt every time you are in a car, and please drive safely and not be distracted with cell phones and texting while driving.   Fat and Cholesterol Restricted Diet Getting too much fat and cholesterol in your diet may cause health problems. Following this diet helps keep your fat and cholesterol at normal levels. This can keep you from getting sick. What types of fat should I choose?  Choose monosaturated and polyunsaturated fats. These are found in foods such as olive oil, canola oil, flaxseeds, walnuts, almonds, and seeds.  Eat more omega-3 fats. Good choices include salmon, mackerel, sardines, tuna, flaxseed oil, and ground flaxseeds.  Limit  saturated fats. These are in animal products such as meats, butter, and cream. They can also be in plant products such as palm oil, palm kernel oil, and coconut oil.  Avoid foods with partially hydrogenated oils in them. These contain trans fats. Examples of foods that have trans fats are stick margarine, some tub margarines, cookies, crackers, and other baked goods. What general guidelines do I need to follow?  Check food labels. Look for the words "trans fat" and "saturated fat."  When preparing a meal: ? Fill half of your plate with vegetables and green salads. ? Fill one fourth of your plate with whole grains. Look for the word "whole" as the first word in the ingredient list. ? Fill one fourth of your plate with lean protein foods.  Eat more foods that have fiber, like apples, carrots, beans, peas, and barley.  Eat more home-cooked foods. Eat less at restaurants and buffets.  Limit or avoid alcohol.  Limit foods high in starch and sugar.  Limit fried foods.  Cook foods without frying them. Baking, boiling, grilling, and broiling are all great options.  Lose weight if you are overweight. Losing even a small amount of weight can help your overall health. It can also help prevent diseases such as diabetes and heart disease. What foods can I eat? Grains Whole grains, such as whole wheat or whole grain breads, crackers, cereals, and pasta. Unsweetened oatmeal, bulgur, barley, quinoa, or brown rice. Corn or whole  wheat flour tortillas. Vegetables Fresh or frozen vegetables (raw, steamed, roasted, or grilled). Green salads. Fruits All fresh, canned (in natural juice), or frozen fruits. Meat and Other Protein Products Ground beef (85% or leaner), grass-fed beef, or beef trimmed of fat. Skinless chicken or [place]. Ground chicken or [place]. Pork trimmed of fat. All fish and seafood. Eggs. Dried beans, peas, or lentils. Unsalted nuts or seeds. Unsalted canned or dry beans. Dairy Low-fat  dairy products, such as skim or 1% milk, 2% or reduced-fat cheeses, low-fat ricotta or cottage cheese, or plain low-fat yogurt. Fats and Oils Tub margarines without trans fats. Light or reduced-fat mayonnaise and salad dressings. Avocado. Olive, canola, sesame, or safflower oils. Natural peanut or almond butter (choose ones without added sugar and oil). The items listed above may not be a complete list of recommended foods or beverages. Contact your dietitian for more options. What foods are not recommended? Grains White bread. White pasta. White rice. Cornbread. Bagels, pastries, and croissants. Crackers that contain trans fat. Vegetables White potatoes. Corn. Creamed or fried vegetables. Vegetables in a cheese sauce. Fruits Dried fruits. Canned fruit in light or heavy syrup. Fruit juice. Meat and Other Protein Products Fatty cuts of meat. Ribs, chicken wings, bacon, sausage, bologna, salami, chitterlings, fatback, hot dogs, bratwurst, and packaged luncheon meats. Liver and organ meats. Dairy Whole or 2% milk, cream, half-and-half, and cream cheese. Whole milk cheeses. Whole-fat or sweetened yogurt. Full-fat cheeses. Nondairy creamers and whipped toppings. Processed cheese, cheese spreads, or cheese curds. Sweets and Desserts Corn syrup, sugars, honey, and molasses. Candy. Jam and jelly. Syrup. Sweetened cereals. Cookies, pies, cakes, donuts, muffins, and ice cream. Fats and Oils Butter, stick margarine, lard, shortening, ghee, or bacon fat. Coconut, palm kernel, or palm oils. Beverages Alcohol. Sweetened drinks (such as sodas, lemonade, and fruit drinks or punches). The items listed above may not be a complete list of foods and beverages to avoid. Contact your dietitian for more information. This information is not intended to replace advice given to you by your health care provider. Make sure you discuss any questions you have with your health care provider. Document Released: 03/11/2012  Document Revised: 05/17/2016 Document Reviewed: 12/10/2013 Elsevier Interactive Patient Education  2018 [employer] Inc[employer]   Sleep Apnea Sleep apnea is a condition in which breathing pauses or becomes shallow during sleep. Episodes of sleep apnea usually last 10 seconds or longer, and they may occur as many as 20 times an hour. Sleep apnea disrupts your sleep and keeps your body from getting the rest that it needs. This condition can increase your risk of certain health problems, including:  Heart attack.  Stroke.  Obesity.  Diabetes.  Heart failure.  Irregular heartbeat.  There are three kinds of sleep apnea:  Obstructive sleep apnea. This kind is caused by a blocked or collapsed airway.  Central sleep apnea. This kind happens when the part of the brain that controls breathing does not send the correct signals to the muscles that control breathing.  Mixed sleep apnea. This is a combination of obstructive and central sleep apnea.  What are the causes? The most common cause of this condition is a collapsed or blocked airway. An airway can collapse or become blocked if:  Your throat muscles are abnormally relaxed.  Your tongue and tonsils are larger than normal.  You are overweight.  Your airway is smaller than normal.  What increases the risk? This condition is more likely to develop in people who:  Are overweight.  Smoke.  Have a smaller than normal airway.  Are elderly.  Are male.  Drink alcohol.  Take sedatives or tranquilizers.  Have a family history of sleep apnea.  What are the signs or symptoms? Symptoms of this condition include:  Trouble staying asleep.  Daytime sleepiness and tiredness.  Irritability.  Loud snoring.  Morning headaches.  Trouble concentrating.  Forgetfulness.  Decreased interest in sex.  Unexplained sleepiness.  Mood swings.  Personality changes.  Feelings of depression.  Waking up often during the night to  urinate.  Dry mouth.  Sore throat.  How is this diagnosed? This condition may be diagnosed with:  A medical history.  A physical exam.  A series of tests that are done while you are sleeping (sleep study). These tests are usually done in a sleep lab, but they may also be done at home.  How is this treated? Treatment for this condition aims to restore normal breathing and to ease symptoms during sleep. It may involve managing health issues that can affect breathing, such as high blood pressure or obesity. Treatment may include:  Sleeping on your side.  Using a decongestant if you have nasal congestion.  Avoiding the use of depressants, including alcohol, sedatives, and narcotics.  Losing weight if you are overweight.  Making changes to your diet.  Quitting smoking.  Using a device to open your airway while you sleep, such as: ? An oral appliance. This is a custom-made mouthpiece that shifts your lower jaw forward. ? A continuous positive airway pressure (CPAP) device. This device delivers oxygen to your airway through a mask. ? A nasal expiratory positive airway pressure (EPAP) device. This device has valves that you put into each nostril. ? A bi-level positive airway pressure (BPAP) device. This device delivers oxygen to your airway through a mask.  Surgery if other treatments do not work. During surgery, excess tissue is removed to create a wider airway.  It is important to get treatment for sleep apnea. Without treatment, this condition can lead to:  High blood pressure.  Coronary artery disease.  (Men) An inability to achieve or maintain an erection (impotence).  Reduced thinking abilities.  Follow these instructions at home:  Make any lifestyle changes that your health care provider recommends.  Eat a healthy, well-balanced diet.  Take over-the-counter and prescription medicines only as told by your health care provider.  Avoid using depressants, including  alcohol, sedatives, and narcotics.  Take steps to lose weight if you are overweight.  If you were given a device to open your airway while you sleep, use it only as told by your health care provider.  Do not use any tobacco products, such as cigarettes, chewing tobacco, and e-cigarettes. If you need help quitting, ask your health care provider.  Keep all follow-up visits as told by your health care provider. This is important. Contact a health care provider if:  The device that you received to open your airway during sleep is uncomfortable or does not seem to be working.  Your symptoms do not improve.  Your symptoms get worse. Get help right away if:  You develop chest pain.  You develop shortness of breath.  You develop discomfort in your back, arms, or stomach.  You have trouble speaking.  You have weakness on one side of your body.  You have drooping in your face. These symptoms may represent a serious problem that is an emergency. Do not wait to see if the symptoms will go away. Get  medical help right away. Call your local emergency services (911 in the [place] Do not drive yourself to the hospital. This information is not intended to replace advice given to you by your health care provider. Make sure you discuss any questions you have with your health care provider. Document Released: 08/31/2002 Document Revised: 05/06/2016 Document Reviewed: 06/20/2015 Elsevier Interactive Patient Education  [employer].  We discussed limiting alcohol while on vacation. Let us know if snoring/sleep worsens. Use some bacitracin on the area on your back.

## 2018-05-07 ENCOUNTER — Encounter: Payer: Self-pay | Admitting: Family Medicine

## 2018-05-07 ENCOUNTER — Ambulatory Visit (INDEPENDENT_AMBULATORY_CARE_PROVIDER_SITE_OTHER): Payer: BLUE CROSS/BLUE SHIELD | Admitting: Family Medicine

## 2018-05-07 VITALS — BP 100/70 | HR 60 | Temp 96.9°F | Ht 74.25 in | Wt 219.0 lb

## 2018-05-07 DIAGNOSIS — F1010 Alcohol abuse, uncomplicated: Secondary | ICD-10-CM

## 2018-05-07 DIAGNOSIS — E7800 Pure hypercholesterolemia, unspecified: Secondary | ICD-10-CM | POA: Diagnosis not present

## 2018-05-07 DIAGNOSIS — Z0000 Encounter for general adult medical examination without abnormal findings: Secondary | ICD-10-CM

## 2018-05-07 DIAGNOSIS — R7301 Impaired fasting glucose: Secondary | ICD-10-CM | POA: Diagnosis not present

## 2018-05-07 DIAGNOSIS — J309 Allergic rhinitis, unspecified: Secondary | ICD-10-CM

## 2018-05-07 DIAGNOSIS — G4733 Obstructive sleep apnea (adult) (pediatric): Secondary | ICD-10-CM | POA: Diagnosis not present

## 2018-05-07 LAB — COMPREHENSIVE METABOLIC PANEL
A/G RATIO: 1.8 (ref 1.2–2.2)
ALBUMIN: 4.7 g/dL (ref 3.5–5.5)
ALT: 20 IU/L (ref 0–44)
AST: 16 IU/L (ref 0–40)
Alkaline Phosphatase: 81 IU/L (ref 39–117)
BUN/Creatinine Ratio: 16 (ref 9–20)
BUN: 20 mg/dL (ref 6–24)
Bilirubin Total: 0.6 mg/dL (ref 0.0–1.2)
CALCIUM: 9.8 mg/dL (ref 8.7–10.2)
CO2: 24 mmol/L (ref 20–29)
Chloride: 103 mmol/L (ref 96–106)
Creatinine, Ser: 1.23 mg/dL (ref 0.76–1.27)
GFR, EST AFRICAN AMERICAN: 80 mL/min/{1.73_m2} (ref >59)
GFR, EST NON AFRICAN AMERICAN: 69 mL/min/{1.73_m2} (ref >59)
Globulin, Total: 2.6 g/dL (ref 1.5–4.5)
Glucose: 105 mg/dL — ABNORMAL HIGH (ref 65–99)
POTASSIUM: 4.5 mmol/L (ref 3.5–5.2)
Sodium: 141 mmol/L (ref 134–144)
TOTAL PROTEIN: 7.3 g/dL (ref 6.0–8.5)

## 2018-05-07 LAB — POCT URINALYSIS DIP (PROADVANTAGE DEVICE)
Bilirubin, UA: NEGATIVE
Blood, UA: NEGATIVE
Glucose, UA: NEGATIVE mg/dL
Ketones, POC UA: NEGATIVE mg/dL
LEUKOCYTES UA: NEGATIVE
NITRITE UA: NEGATIVE
PROTEIN UA: NEGATIVE mg/dL
Specific Gravity, Urine: 1.03
UUROB: NEGATIVE
pH, UA: 5.5 (ref 5.0–8.0)

## 2018-05-07 LAB — HEMOGLOBIN A1C
ESTIMATED AVERAGE GLUCOSE: 123 mg/dL
HEMOGLOBIN A1C: 5.9 % — AB (ref 4.8–5.6)

## 2018-05-07 LAB — LIPID PANEL
CHOL/HDL RATIO: 3.9 ratio (ref 0.0–5.0)
Cholesterol, Total: 215 mg/dL — ABNORMAL HIGH (ref 100–199)
HDL: 55 mg/dL (ref >39)
LDL Calculated: 143 mg/dL — ABNORMAL HIGH (ref 0–99)
TRIGLYCERIDES: 85 mg/dL (ref 0–149)
VLDL Cholesterol Cal: 17 mg/dL (ref 5–40)

## 2018-08-12 DIAGNOSIS — M9905 Segmental and somatic dysfunction of pelvic region: Secondary | ICD-10-CM | POA: Diagnosis not present

## 2018-08-12 DIAGNOSIS — M5127 Other intervertebral disc displacement, lumbosacral region: Secondary | ICD-10-CM | POA: Diagnosis not present

## 2018-08-12 DIAGNOSIS — M9904 Segmental and somatic dysfunction of sacral region: Secondary | ICD-10-CM | POA: Diagnosis not present

## 2018-08-12 DIAGNOSIS — M9903 Segmental and somatic dysfunction of lumbar region: Secondary | ICD-10-CM | POA: Diagnosis not present

## 2018-08-15 DIAGNOSIS — M9904 Segmental and somatic dysfunction of sacral region: Secondary | ICD-10-CM | POA: Diagnosis not present

## 2018-08-15 DIAGNOSIS — M5127 Other intervertebral disc displacement, lumbosacral region: Secondary | ICD-10-CM | POA: Diagnosis not present

## 2018-08-15 DIAGNOSIS — M9905 Segmental and somatic dysfunction of pelvic region: Secondary | ICD-10-CM | POA: Diagnosis not present

## 2018-08-15 DIAGNOSIS — M9903 Segmental and somatic dysfunction of lumbar region: Secondary | ICD-10-CM | POA: Diagnosis not present

## 2018-08-19 DIAGNOSIS — M9905 Segmental and somatic dysfunction of pelvic region: Secondary | ICD-10-CM | POA: Diagnosis not present

## 2018-08-19 DIAGNOSIS — M9904 Segmental and somatic dysfunction of sacral region: Secondary | ICD-10-CM | POA: Diagnosis not present

## 2018-08-19 DIAGNOSIS — M9903 Segmental and somatic dysfunction of lumbar region: Secondary | ICD-10-CM | POA: Diagnosis not present

## 2018-08-19 DIAGNOSIS — M5127 Other intervertebral disc displacement, lumbosacral region: Secondary | ICD-10-CM | POA: Diagnosis not present

## 2018-08-27 DIAGNOSIS — M9905 Segmental and somatic dysfunction of pelvic region: Secondary | ICD-10-CM | POA: Diagnosis not present

## 2018-08-27 DIAGNOSIS — M9904 Segmental and somatic dysfunction of sacral region: Secondary | ICD-10-CM | POA: Diagnosis not present

## 2018-08-27 DIAGNOSIS — M5127 Other intervertebral disc displacement, lumbosacral region: Secondary | ICD-10-CM | POA: Diagnosis not present

## 2018-08-27 DIAGNOSIS — M9903 Segmental and somatic dysfunction of lumbar region: Secondary | ICD-10-CM | POA: Diagnosis not present

## 2018-09-11 DIAGNOSIS — L820 Inflamed seborrheic keratosis: Secondary | ICD-10-CM | POA: Diagnosis not present

## 2018-10-09 ENCOUNTER — Ambulatory Visit (INDEPENDENT_AMBULATORY_CARE_PROVIDER_SITE_OTHER): Payer: 59 | Admitting: Family Medicine

## 2018-10-09 ENCOUNTER — Encounter: Payer: Self-pay | Admitting: Family Medicine

## 2018-10-09 VITALS — BP 110/68 | HR 64 | Ht 74.25 in | Wt 224.0 lb

## 2018-10-09 DIAGNOSIS — M62830 Muscle spasm of back: Secondary | ICD-10-CM | POA: Diagnosis not present

## 2018-10-09 DIAGNOSIS — M545 Low back pain, unspecified: Secondary | ICD-10-CM

## 2018-10-09 DIAGNOSIS — G8929 Other chronic pain: Secondary | ICD-10-CM

## 2018-10-09 MED ORDER — MELOXICAM 15 MG PO TABS: 15.0000 mg | ORAL_TABLET | Freq: Every day | ORAL | 0 refills | Status: DC

## 2018-10-09 MED ORDER — METAXALONE 800 MG PO TABS: 400.0000 mg | ORAL_TABLET | Freq: Three times a day (TID) | ORAL | 0 refills | Status: DC | PRN

## 2018-10-09 NOTE — Patient Instructions (Signed)
Go to [facility] for x-rays of your back ([address]).  Continue heat, massage, stretches and topical medications such as Biofreeze Physical therapy is an option--I recommend [facility] (they are good and don't require a referral; if you prefer another PT group, I'd be happy to do a referral for you.  Take the meloxicam once daily with food (dinner), using it regularly for a week to ten days, then you can back off and see if/when needed. Do not use other pain medications when taking this--only acetaminophen (Tylenol) is okay to use with this. If it bothers your stomach, cut the dose in half. Use the muscle relaxant (metaxolone/skelaxin) only if needed for tight back muscles--ie tonight after exercising. You will likely find yourself much less stiff during the night and morning.   Back Exercises The following exercises strengthen the muscles that help to support the back. They also help to keep the lower back flexible. Doing these exercises can help to prevent back pain or lessen existing pain. If you have back pain or discomfort, try doing these exercises 2-3 times each day or as told by your health care provider. When the pain goes away, do them once each day, but increase the number of times that you repeat the steps for each exercise (do more repetitions). If you do not have back pain or discomfort, do these exercises once each day or as told by your health care provider. Exercises Single Knee to Chest Repeat these steps 3-5 times for each leg: 1. Lie on your back on a firm bed or the floor with your legs extended. 2. Bring one knee to your chest. Your other leg should stay extended and in contact with the floor. 3. Hold your knee in place by grabbing your knee or thigh. 4. Pull on your knee until you feel a gentle stretch in your lower back. 5. Hold the stretch for 10-30 seconds. 6. Slowly release and straighten your leg. Pelvic Tilt Repeat these steps  5-10 times: 1. Lie on your back on a firm bed or the floor with your legs extended. 2. Bend your knees so they are pointing toward the ceiling and your feet are flat on the floor. 3. Tighten your lower abdominal muscles to press your lower back against the floor. This motion will tilt your pelvis so your tailbone points up toward the ceiling instead of pointing to your feet or the floor. 4. With gentle tension and even breathing, hold this position for 5-10 seconds. Cat-Cow Repeat these steps until your lower back becomes more flexible: 1. Get into a hands-and-knees position on a firm surface. Keep your hands under your shoulders, and keep your knees under your hips. You may place padding under your knees for comfort. 2. Let your head hang down, and point your tailbone toward the floor so your lower back becomes rounded like the back of a cat. 3. Hold this position for 5 seconds. 4. Slowly lift your head and point your tailbone up toward the ceiling so your back forms a sagging arch like the back of a cow. 5. Hold this position for 5 seconds.  Press-Ups Repeat these steps 5-10 times: 1. Lie on your abdomen (face-down) on the floor. 2. Place your palms near your head, about shoulder-width apart. 3. While you keep your back as relaxed as possible and keep your hips on the floor, slowly straighten your arms to raise the top half of your body and lift your shoulders. Do not  use your back muscles to raise your upper torso. You may adjust the placement of your hands to make yourself more comfortable. 4. Hold this position for 5 seconds while you keep your back relaxed. 5. Slowly return to lying flat on the floor.  Bridges Repeat these steps 10 times: 1. Lie on your back on a firm surface. 2. Bend your knees so they are pointing toward the ceiling and your feet are flat on the floor. 3. Tighten your buttocks muscles and lift your buttocks off of the floor until your waist is at almost the same  height as your knees. You should feel the muscles working in your buttocks and the back of your thighs. If you do not feel these muscles, slide your feet 1-2 inches farther away from your buttocks. 4. Hold this position for 3-5 seconds. 5. Slowly lower your hips to the starting position, and allow your buttocks muscles to relax completely. If this exercise is too easy, try doing it with your arms crossed over your chest. Abdominal Crunches Repeat these steps 5-10 times: 1. Lie on your back on a firm bed or the floor with your legs extended. 2. Bend your knees so they are pointing toward the ceiling and your feet are flat on the floor. 3. Cross your arms over your chest. 4. Tip your chin slightly toward your chest without bending your neck. 5. Tighten your abdominal muscles and slowly raise your trunk (torso) high enough to lift your shoulder blades a tiny bit off of the floor. Avoid raising your torso higher than that, because it can put too much stress on your low back and it does not help to strengthen your abdominal muscles. 6. Slowly return to your starting position. Back Lifts Repeat these steps 5-10 times: 1. Lie on your abdomen (face-down) with your arms at your sides, and rest your forehead on the floor. 2. Tighten the muscles in your legs and your buttocks. 3. Slowly lift your chest off of the floor while you keep your hips pressed to the floor. Keep the back of your head in line with the curve in your back. Your eyes should be looking at the floor. 4. Hold this position for 3-5 seconds. 5. Slowly return to your starting position. Contact a health care provider if:  Your back pain or discomfort gets much worse when you do an exercise.  Your back pain or discomfort does not lessen within 2 hours after you exercise. If you have any of these problems, stop doing these exercises right away. Do not do them again unless your health care provider says that you can. Get help right away  if:  You develop sudden, severe back pain. If this happens, stop doing the exercises right away. Do not do them again unless your health care provider says that you can. This information is not intended to replace advice given to you by your health care provider. Make sure you discuss any questions you have with your health care provider. Document Released: 10/18/2004 Document Revised: 01/14/2018 Document Reviewed: 11/04/2014 Elsevier Interactive Patient Education  [employer].

## 2018-10-09 NOTE — Progress Notes (Signed)
Chief Complaint  Patient presents with  . Back Pain    low back pain that flares up from time to time.    Patient presents to discuss his low back pain.  He [facility] in December for his back. He has had some ongoing issues that he would like to discuss.  He has had some low back since his late 20's, pain occurring after a lot of activity.  He mostly describes stiffness, sometimes pain shoots down the legs, short-lived/sharp.. In the last 7-8 years has been stretching more, has been going to the gym 3x/week. In the last 6-7 months he has noted some increased stiffness in his low back, especially at night and in the morning.  He has pain if he lays flat in bed for too long, feels like it locks up. Feels stiff, hard to lean forward to lift the toilet seat up when he gets up at night to void.  He has the discomfort after tennis or basket ball, within an hour after finishing the activity, and the next day.   After 5 holes of golf on Sunday, started noticing his back tightening, but Monday morning was hard to get out of bed, and felt so stiff it was hard to put on his pants for the gym.  He has been doing stretches in the morning after his shower (on floor, to loosen things up).  Back feels better as the day progresses.  Last week (6 days ago) spoke to his friend, Dr. Artist PaisYoo, and reached out to him again on Monday of this week, when his pain was worse (after golfing the day prior).  Since that conversation, he has been taking 400mg  BID of ibuprofen (at lunch/dinner) since Monday. It has felt better, been "pretty good" the last 2 days (but also hasn't been as active).  Also notices it more with weather changes  Massages monthly help. Felt better with a recent deep tissue massage, was told his muscles were tight. Biofreeze has also helped  Recalls having MRI in past (2006?), and recalls being told of discoloration of cartilage, disk.  PMH, PSH, SH reviewed  Current  Outpatient Medications on File Prior to Visit  Medication Sig Dispense Refill  . fluticasone (FLONASE) 50 MCG/ACT nasal spray Place 2 sprays into both nostrils daily.    Marland Kitchen. ibuprofen (ADVIL,MOTRIN) 400 MG tablet Take 400 mg by mouth 2 (two) times daily.    Marland Kitchen. levocetirizine (XYZAL) 5 MG tablet Take 5 mg by mouth every evening.     No current facility-administered medications on file prior to visit.    No Known Allergies  ROS: no fever, chills, numbness, tingling, weakness, bladder or bowel changes. No other joint pains. Denies GI problems, bleeding or bruising.   PHYSICAL EXAM:  BP 110/68   Pulse 64   Ht 6' 2.25" (1.886 m)   Wt 224 lb (101.6 kg)   BMI 28.57 kg/m   Well-appearing, pleasant male in no distress Back: no spinal or CVA tenderness, no SI tenderness.  He has some tightness (but not acute spasm) in paraspinous muscles bilaterally in lumbar spine. Neuro: DTR's 2+ and symmetric. Normal strength, sensation, gait  ASSESSMENT/PLAN:  Chronic midline low back pain without sciatica - Plan: DG Lumbar Spine Complete, meloxicam (MOBIC) 15 MG tablet  Muscle spasm of back - Plan: metaxalone (SKELAXIN) 800 MG tablet   X-ray, PT (if not improving with meds, if desired). meloxicam Skelaxin NSAID risks/precautions reviewed in detail. Risks/side effects  of meds reviewed Discussed core strengthening, hamstring stretches, home exercises. Discussed Ddx, and expected course, when MRI is needed, and when referral might be needed. All questions answered. 25-30 min visit, more than 1/2 spent counseling.    [facility] for x-rays of your back ([address]).  Continue heat, massage, stretches and topical medications such as Biofreeze Physical therapy is an option--I recommend [facility] (they are good and don't require a referral; if you prefer another PT group, I'd be happy to do a referral for you.  Take the meloxicam once daily with food  (dinner), using it regularly for a week to ten days, then you can back off and see if/when needed. Do not use other pain medications when taking this--only acetaminophen (Tylenol) is okay to use with this. If it bothers your stomach, cut the dose in half. Use the muscle relaxant (metaxolone/skelaxin) only if needed for tight back muscles--ie tonight after exercising. You will likely find yourself much less stiff during the night and morning.

## 2018-10-15 ENCOUNTER — Ambulatory Visit
Admission: RE | Admit: 2018-10-15 | Discharge: 2018-10-15 | Disposition: A | Discharge status: Home / Self Care | Payer: BLUE CROSS/BLUE SHIELD | Source: Ambulatory Visit | Attending: Family Medicine | Admitting: Family Medicine

## 2018-10-15 DIAGNOSIS — G8929 Other chronic pain: Secondary | ICD-10-CM

## 2018-10-15 DIAGNOSIS — M545 Low back pain: Principal | ICD-10-CM

## 2018-10-15 DIAGNOSIS — M5137 Other intervertebral disc degeneration, lumbosacral region: Secondary | ICD-10-CM | POA: Diagnosis not present

## 2018-11-30 ENCOUNTER — Other Ambulatory Visit: Payer: Self-pay | Admitting: Family Medicine

## 2018-11-30 DIAGNOSIS — M545 Low back pain, unspecified: Secondary | ICD-10-CM

## 2018-11-30 DIAGNOSIS — M62830 Muscle spasm of back: Secondary | ICD-10-CM

## 2018-11-30 DIAGNOSIS — G8929 Other chronic pain: Secondary | ICD-10-CM

## 2018-12-01 NOTE — Telephone Encounter (Signed)
Patient did request these-he has one left of each. He said that they really help to occasionally take after he plays basketball, tennis and golf.

## 2019-03-01 ENCOUNTER — Other Ambulatory Visit: Payer: Self-pay | Admitting: Family Medicine

## 2019-03-01 DIAGNOSIS — M62830 Muscle spasm of back: Secondary | ICD-10-CM

## 2019-05-12 NOTE — Patient Instructions (Signed)
  HEALTH MAINTENANCE RECOMMENDATIONS:  It is recommended that you get at least 30 minutes of aerobic exercise at least 5 days/week (for weight loss, you may need as much as 60-90 minutes). This can be any activity that gets your heart rate up. This can be divided in 10-15 minute intervals if needed, but try and build up your endurance at least once a week.  Weight bearing exercise is also recommended twice weekly.  Eat a healthy diet with lots of vegetables, fruits and fiber.  "Colorful" foods have a lot of vitamins (ie green vegetables, tomatoes, red peppers, etc).  Limit sweet tea, regular sodas and alcoholic beverages, all of which has a lot of calories and sugar.  Up to 2 alcoholic drinks daily may be beneficial for men (unless trying to lose weight, watch sugars).  Drink a lot of water.  Sunscreen of at least SPF 30 should be used on all sun-exposed parts of the skin when outside between the hours of 10 am and 4 pm (not just when at beach or pool, but even with exercise, golf, tennis, and yard work!)  Use a sunscreen that says "broad spectrum" so it covers both UVA and UVB rays, and make sure to reapply every 1-2 hours.  Remember to change the batteries in your smoke detectors when changing your clock times in the spring and fall. Carbon monoxide detectors are recommended for your home.  Use your seat belt every time you are in a car, and please drive safely and not be distracted with cell phones and texting while driving.  At age 39, it is recommended that you have a colonoscopy, and get the shingles vaccine (called Shingrix).  We can do this at your visit next year, or sooner, if you prefer.  You will need to check with your insurance to verify that it is covered before scheduling visit.

## 2019-05-12 NOTE — Progress Notes (Signed)
Chief Complaint  Patient presents with  . Annual Exam    fasting annual exam. No new concerns.   . Flu Vaccine    wants to discuss with you.    Gregory Wright is a 48 y.o. male who presents for a complete physical.  He has the following concerns:  He was seen in January with back pain.  He was treated with meloxicam, skelaxin. PT was recommended if not improved with medication. Xrays showed: IMPRESSION: 1. Degenerative disc disease at L5-S1. 2. Minimal calcified atherosclerotic change in the distal abdominal aorta.  He is doing better overall.  Mainly has a lot of stiffness in the morning.  Does some stretching and yoga every morning to help with the stiffness, which does help. After golf, tennis/pickleball or yardwork he has increased pain and sometimes needs to take meloxicam.  It helps. Back locked up while playing golf recently, took muscle relaxants, could barely lift his legs to put on pants the next morning, was okay by noon. He gets regular massages, has had cupping, scraping, which helps. He only rarely gets shooting pains.  No numbness, tingling, weakness, bowel or bladder problems.   Hypercholesterolemia: Diet includes: +cheese, red meat up to 2x/week, mostly chicken/fish, 2 eggs/week.  He is fasting for repeat labs today. Last lipids: Lab Results  Component Value Date   CHOL 215 (H) 05/07/2018   HDL 55 05/07/2018   LDLCALC 143 (H) 05/07/2018   TRIG 85 05/07/2018   CHOLHDL 3.9 05/07/2018   Previously had problems with lightheadedness when playing basketball, resolved by staying better hydrated. Only occurred once recently, much improved.  Impaired fasting glucose: A1c was up to 5.9 last year (had gotten down to 5.6, had been 5.9 in 2016 and 2017).Marland Kitchen. He continues to no longer drink regular soda at home (has regular if at restaurant, less often), usually sugar-free Cokes. Continues to drink a lot of water, G2 Gatorade or Zero.  Continues to eat brown rice instead of  white, and tries to limit candy. He likes Kathlene NovemberMike [employer]n Ikes, sometimes eats while driving or at work, but tries to keep an eye on this. Lab Results  Component Value Date   HGBA1C 5.9 (H) 05/07/2018   He was noted to have mild OSA on sleep study in 07/2017.  Some nights are better than others (re: snoring, per wife).  He feels well rested in the mornings, no daytime somnolence.  Allergies, seasonal.  Uses Flonase currently, hasn't started up the xyzal yet. Usually spring allergies are worse.  Father had another stroke, is a paraplegic due to an accident.  Going to [place] tomorrow to help out his mother, deal with paperwork, etc.  Immunization History  Administered Date(s) Administered  . Td 04/25/2017  . Tdap 03/26/2007   Doesn't get flu shots (is offered at work) Last colonoscopy: never  Last PSA: never  Ophtho: yearly Dentist: regularly, 2x/year Exercise: can't play basketball due to COVID.  Playing a lot of tennis and pickleball, and walks 3x/week x 45 minutes.  Pushups in morning, otherwise no current weight training due to gyms being closed since March.  Past Medical History:  Diagnosis Date  . Allergy    seasonal  . Anxiety state, unspecified 2006   work-related, resolved  . Hyperlipidemia    mild  . Kidney stone 05/2015   calcium oxalate    Past Surgical History:  Procedure Laterality Date  . INGUINAL HERNIA REPAIR  1991   left  . VASECTOMY  6/05  Social History   Socioeconomic History  . Marital status: Married    Spouse name: Bayard Huggerrin Yearwood  . Number of children: 2  . Years of education: Not on file  . Highest education level: Not on file  Occupational History  . 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 Financial resource strain: Not on file  . Food insecurity    Worry: Not on file    Inability: Not on file  . Transportation needs    Medical: Not on file    Non-medical: Not on file  Tobacco Use   . Smoking status: Never Smoker  . Smokeless tobacco: Never Used  Substance and Sexual Activity  . Alcohol use: Yes    Alcohol/week: 0.0 standard drinks    Comment: 3-4 over the weekends in the summer (up to 10/day while on recent beach vacation)  . Drug use: No  . Sexual activity: Yes    Birth control/protection: Surgical    Comment: vasectomy  Lifestyle  . Physical activity    Days per week: Not on file    Minutes per session: Not on file  . Stress: Not on file  Relationships  . Social Musicianconnections    Talks on phone: Not on file    Gets together: Not on file    Attends religious service: Not on file    Active member of club or organization: Not on file    Attends meetings of clubs or organizations: Not on file    Relationship status: Not on file  . Intimate partner violence    Fear of current or ex partner: Not on file    Emotionally abused: Not on file    Physically abused: Not on file    Forced sexual activity: Not on file  Other Topics Concern  . Not on file  Social History Narrative   Married, 2 sons, 2 dogs. 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  Work 1 day/week late (and 1 day/month stays til 1 am), recently more night shift, due to being understaffed.   Son will be attending [employer] in [place].    Family History  Problem Relation Age of Onset  . Hypertension Father   . Graves' disease Father   . Stroke Father [id number]  . Cancer Cousin 37       lung (farmer, nonsmoker)--1st cousin  . Cancer Maternal Aunt        spread to lungs, ?primary; also had brain tumor  . Diabetes Paternal Grandmother   . Rheum arthritis Cousin   . Stroke Paternal Uncle [id number]    Outpatient Encounter Medications as of 05/14/2019  Medication Sig Note  . fluticasone (FLONASE) 50 MCG/ACT nasal spray Place 2 sprays into both nostrils daily.   . meloxicam (MOBIC) 15 MG tablet TAKE 1 TABLET BY MOUTH EVERY DAY   . metaxalone (SKELAXIN) 800 MG tablet TAKE 1/2-1 TABLET (400-800 MG TOTAL)  BY MOUTH 3 (THREE) TIMES DAILY AS NEEDED FOR MUSCLE SPASMS.   Marland Kitchen. ibuprofen (ADVIL,MOTRIN) 400 MG tablet Take 400 mg by mouth 2 (two) times daily.   Marland Kitchen. levocetirizine (XYZAL) 5 MG tablet Take 5 mg by mouth every evening. 05/07/2018: Takes daily prn for allergies   No facility-administered encounter medications on file as of 05/14/2019.     No Known Allergies  ROS: The patient denies anorexia, fever, headaches, vision loss, decreased hearing, ear pain, hoarseness, chest pain, palpitations, dizziness, syncope, dyspnea on exertion, cough, swelling, nausea, vomiting, diarrhea, constipation,  abdominal pain, melena, hematochezia, hematuria, incontinence, erectile dysfunction, nocturia, weakened urine stream, dysuria, joint pains, numbness, tingling, weakness, tremor, suspicious skin lesions, depression, anxiety, abnormal bleeding/bruising, or enlarged lymph nodes  Rare hearburn with spicy/tomato-based foods. Seasonal allergies in spring/fall Low back pain/stiffness per HPI Snoring, no unrefreshed sleep or daytime somnolence    PHYSICAL EXAM:  BP 120/74   Pulse 72   Temp (!) 96.9 F (36.1 C) (Temporal)   Ht 6\' 3"  (1.905 m)   Wt 219 lb 9.6 oz (99.6 kg)   BMI 27.45 kg/m   Wt Readings from Last 3 Encounters:  05/14/19 219 lb 9.6 oz (99.6 kg)  10/09/18 224 lb (101.6 kg)  05/07/18 219 lb (99.3 kg)    General Appearance:  Alert, cooperative, no distress, appears stated age   Head:  Normocephalic, without obvious abnormality, atraumatic   Eyes:  PERRL, conjunctiva/corneas clear, EOM's intact, fundi benign   Ears:  Normal TM's and external ear canals   Nose:  Not examined, wearing mask due to COVID-19 pandemic  Throat:  Not examined, wearing mask due to COVID-19 pandemic  Neck:  Supple, no lymphadenopathy; thyroid: no enlargement/tenderness/ nodules; no carotid bruit or JVD   Back:  Spine nontender, no curvature, ROM normal, no CVA tenderness   Lungs:  Clear to auscultation  bilaterally without wheezes, rales or ronchi; respirations unlabored   Chest Wall:  No tenderness or deformity   Heart:  Regular rate and rhythm, S1 and S2 normal, no murmur, rub or gallop   Breast Exam:  No chest wall tenderness, masses or gynecomastia   Abdomen:  Soft, non-tender, nondistended, normoactive bowel sounds,  no masses, no hepatosplenomegaly   Genitalia:  Normal male external genitalia without lesions. Testicles without masses. No inguinal hernias.There are nontender, mobile cysts infero-posterior to the testicleon the right. No inguinal lymphadenopathy  Rectal:  Normal sphincter tone, no masses or tenderness; guaiac negative stool. Prostate smooth, no nodules, not enlarged.   Extremities:  No clubbing, cyanosis or edema   Pulses:  2+ and symmetric all extremities   Skin:  Skin color, texture, turgor normal, no rashes or lesions.   Lymph nodes:  Cervical, supraclavicular, and axillary nodes normal   Neurologic:  CNII-XII intact, normal strength, sensation and gait; reflexes 2+ and symmetric throughout    Psych: Normal mood, affect, hygiene and grooming    ASSESSMENT/PLAN:  Annual physical exam - Plan: Lipid panel, Comprehensive metabolic panel, CBC with Differential/Platelet, Hemoglobin A1c  Impaired fasting glucose - reviewed proper diet - Plan: Comprehensive metabolic panel, Hemoglobin A1c  Allergic rhinitis, unspecified seasonality, unspecified trigger  Mild obstructive sleep apnea - encouraged to avoid sleeping on back. No significant symptoms currently, has lost weight. Advise [place] if unrefreshed sleep, daytime somnolence develops  Pure hypercholesterolemia - Plan: Lipid panel  Need for influenza vaccination - Plan: Flu Vaccine QUAD 6+ mos PF IM (Fluarix Quad PF)   Mild obstructive sleep apnea - no longer symptomatic, just intermittent snoring. If worsening (unrefreshed sleep, daytime somnolence) to discuss with dentist for  oral appliance   Recommended at least 30 minutes of aerobic activity at least 5 days/week, weight-bearing exercise 2x/week; Monthly self-testicular exams.  Immunization recommendationsdiscussed--flu shot given today (finally agreed, due to concurrent COVID pandemic). Shingrix age 23.Colonoscopy age 29.   F/u 1 year, sooner prn

## 2019-05-14 ENCOUNTER — Encounter: Payer: Self-pay | Admitting: Family Medicine

## 2019-05-14 ENCOUNTER — Ambulatory Visit (INDEPENDENT_AMBULATORY_CARE_PROVIDER_SITE_OTHER): Payer: 59 | Admitting: Family Medicine

## 2019-05-14 ENCOUNTER — Other Ambulatory Visit: Payer: Self-pay

## 2019-05-14 VITALS — BP 120/74 | HR 72 | Temp 96.9°F | Ht 75.0 in | Wt 219.6 lb

## 2019-05-14 DIAGNOSIS — Z0000 Encounter for general adult medical examination without abnormal findings: Secondary | ICD-10-CM | POA: Diagnosis not present

## 2019-05-14 DIAGNOSIS — R7301 Impaired fasting glucose: Secondary | ICD-10-CM | POA: Diagnosis not present

## 2019-05-14 DIAGNOSIS — E7800 Pure hypercholesterolemia, unspecified: Secondary | ICD-10-CM

## 2019-05-14 DIAGNOSIS — Z23 Encounter for immunization: Secondary | ICD-10-CM

## 2019-05-14 DIAGNOSIS — J309 Allergic rhinitis, unspecified: Secondary | ICD-10-CM | POA: Diagnosis not present

## 2019-05-14 DIAGNOSIS — G4733 Obstructive sleep apnea (adult) (pediatric): Secondary | ICD-10-CM

## 2019-05-15 ENCOUNTER — Other Ambulatory Visit: Payer: Self-pay | Admitting: Family Medicine

## 2019-05-15 DIAGNOSIS — M62830 Muscle spasm of back: Secondary | ICD-10-CM

## 2019-05-15 DIAGNOSIS — G8929 Other chronic pain: Secondary | ICD-10-CM

## 2019-05-15 LAB — COMPREHENSIVE METABOLIC PANEL
ALT: 19 IU/L (ref 0–44)
AST: 17 IU/L (ref 0–40)
Albumin/Globulin Ratio: 2.1 (ref 1.2–2.2)
Albumin: 4.9 g/dL (ref 4.0–5.0)
Alkaline Phosphatase: 77 IU/L (ref 39–117)
BUN/Creatinine Ratio: 18 (ref 9–20)
BUN: 23 mg/dL (ref 6–24)
Bilirubin Total: 0.6 mg/dL (ref 0.0–1.2)
CO2: 20 mmol/L (ref 20–29)
Calcium: 9.9 mg/dL (ref 8.7–10.2)
Chloride: 101 mmol/L (ref 96–106)
Creatinine, Ser: 1.27 mg/dL (ref 0.76–1.27)
GFR calc Af Amer: 76 mL/min/{1.73_m2} (ref >59)
GFR calc non Af Amer: 66 mL/min/{1.73_m2} (ref >59)
Globulin, Total: 2.3 g/dL (ref 1.5–4.5)
Glucose: 114 mg/dL — ABNORMAL HIGH (ref 65–99)
Potassium: 4.6 mmol/L (ref 3.5–5.2)
Sodium: 140 mmol/L (ref 134–144)
Total Protein: 7.2 g/dL (ref 6.0–8.5)

## 2019-05-15 LAB — CBC WITH DIFFERENTIAL/PLATELET
Basophils Absolute: 0 10*3/uL (ref 0.0–0.2)
Basos: 1 %
EOS (ABSOLUTE): 0.1 10*3/uL (ref 0.0–0.4)
Eos: 2 %
Hematocrit: 43.8 % (ref 37.5–51.0)
Hemoglobin: 15.2 g/dL (ref 13.0–17.7)
Immature Grans (Abs): 0 10*3/uL (ref 0.0–0.1)
Immature Granulocytes: 0 %
Lymphocytes Absolute: 1.9 10*3/uL (ref 0.7–3.1)
Lymphs: 31 %
MCH: 32.4 pg (ref 26.6–33.0)
MCHC: 34.7 g/dL (ref 31.5–35.7)
MCV: 93 fL (ref 79–97)
Monocytes Absolute: 0.6 10*3/uL (ref 0.1–0.9)
Monocytes: 10 %
Neutrophils Absolute: 3.5 10*3/uL (ref 1.4–7.0)
Neutrophils: 56 %
Platelets: 254 10*3/uL (ref 150–450)
RBC: 4.69 x10E6/uL (ref 4.14–5.80)
RDW: 12.5 % (ref 11.6–15.4)
WBC: 6.2 10*3/uL (ref 3.4–10.8)

## 2019-05-15 LAB — LIPID PANEL
Chol/HDL Ratio: 3.8 ratio (ref 0.0–5.0)
Cholesterol, Total: 219 mg/dL — ABNORMAL HIGH (ref 100–199)
HDL: 58 mg/dL (ref >39)
LDL Calculated: 145 mg/dL — ABNORMAL HIGH (ref 0–99)
Triglycerides: 78 mg/dL (ref 0–149)
VLDL Cholesterol Cal: 16 mg/dL (ref 5–40)

## 2019-05-15 LAB — HEMOGLOBIN A1C
Est. average glucose Bld gHb Est-mCnc: 123 mg/dL
Hgb A1c MFr Bld: 5.9 % — ABNORMAL HIGH (ref 4.8–5.6)

## 2019-05-15 NOTE — Telephone Encounter (Signed)
Is this okay to refill? 

## 2019-06-13 ENCOUNTER — Other Ambulatory Visit: Payer: Self-pay | Admitting: Family Medicine

## 2019-06-13 DIAGNOSIS — G8929 Other chronic pain: Secondary | ICD-10-CM

## 2019-07-02 ENCOUNTER — Telehealth: Payer: Self-pay | Admitting: Family Medicine

## 2019-07-02 NOTE — Telephone Encounter (Signed)
Pt's wife dropped off a form to be completed. Sending to [place] for Dr. Tomi Bamberger

## 2019-07-02 NOTE — Telephone Encounter (Signed)
done

## 2019-07-02 NOTE — Telephone Encounter (Signed)
In your folder 

## 2019-07-27 ENCOUNTER — Other Ambulatory Visit: Payer: Self-pay | Admitting: Family Medicine

## 2019-07-27 DIAGNOSIS — M62830 Muscle spasm of back: Secondary | ICD-10-CM

## 2019-07-27 NOTE — Telephone Encounter (Signed)
Is this okay to refill? 

## 2019-08-27 ENCOUNTER — Other Ambulatory Visit: Payer: Self-pay | Admitting: Family Medicine

## 2019-08-27 DIAGNOSIS — G8929 Other chronic pain: Secondary | ICD-10-CM

## 2019-08-27 DIAGNOSIS — M545 Low back pain, unspecified: Secondary | ICD-10-CM

## 2019-08-28 NOTE — Telephone Encounter (Signed)
Is this okay to refill? 

## 2019-09-15 ENCOUNTER — Other Ambulatory Visit: Payer: 59

## 2020-01-10 ENCOUNTER — Other Ambulatory Visit: Payer: Self-pay | Admitting: Family Medicine

## 2020-01-10 DIAGNOSIS — M62830 Muscle spasm of back: Secondary | ICD-10-CM

## 2020-01-11 NOTE — Telephone Encounter (Signed)
Is this okay to refill? 

## 2020-03-07 ENCOUNTER — Other Ambulatory Visit: Payer: Self-pay | Admitting: Family Medicine

## 2020-03-07 DIAGNOSIS — G8929 Other chronic pain: Secondary | ICD-10-CM

## 2020-03-07 NOTE — Telephone Encounter (Signed)
Is this okay to refill? 

## 2020-03-31 ENCOUNTER — Other Ambulatory Visit: Payer: Self-pay | Admitting: Family Medicine

## 2020-03-31 DIAGNOSIS — Z87898 Personal history of other specified conditions: Secondary | ICD-10-CM

## 2020-03-31 MED ORDER — SCOPOLAMINE 1 MG/3DAYS TD PT72: 1.0000 | MEDICATED_PATCH | TRANSDERMAL | 0 refills | Status: DC

## 2020-04-03 ENCOUNTER — Other Ambulatory Visit: Payer: Self-pay | Admitting: Family Medicine

## 2020-04-03 DIAGNOSIS — M545 Low back pain, unspecified: Secondary | ICD-10-CM

## 2020-04-04 NOTE — Telephone Encounter (Signed)
Not sure if this is auto-refill or not. It was last filled 6/14 for #30, and is to be prn only, NOT to be used every day. You can check with pt, though I recall that he was leaving for vacation this week (not sure exactly when--not sure if he is wanting this refill prior to leaving, or this is auto-refill and he doesn't even need)

## 2020-05-25 NOTE — Progress Notes (Signed)
Chief Complaint  Patient presents with  . Annual Exam    fasting annual exam, no concerns.   . Flu Vaccine    would like to wait on flu vaccine.     Gregory Wright is a 49 y.o. male who presents for a complete physical.   Intermittent back pain.  He uses meloxicam, skelaxin prn for flares.  He ends up needing meloxicam 2x/week, muscle relaxants only a few times/month (needs both after golf). He has some morning stiffness, and stretching and yoga helps.  Has some pain after golf, tennis/pickleball or yardwork.  Regular massages and cupping, scraping, has also helped. No numbness, tingling, weakness, bowel or bladder problems. He denies needing refills on these meds today.  Xrays in 09/2018 showed: IMPRESSION 1. Degenerative disc disease at L5-S1. 2. Minimal calcified atherosclerotic change in the distal abdominal Aorta  Hypercholesterolemia: Diet includes:+cheese/crackers, red meat up to 2x/week, mostly chicken/fish, 2 eggs/week.He is fasting for repeat labs today. Denies changes to diet. Lab Results  Component Value Date   CHOL 219 (H) 05/14/2019   HDL 58 05/14/2019   LDLCALC 145 (H) 05/14/2019   TRIG 78 05/14/2019   CHOLHDL 3.8 05/14/2019   Impaired fasting glucose: A1c was up to5.9 the last 2 years (had gotten down to 5.6, had been 5.9 in 2016 and 2017).Marland KitchenHe continues to no longer drink regular soda at home(has regular if at restaurant, for lunch, no refills; will get Coke Zero if they have it).  Continues to drink a lot of water, G2 Zero. Continues to eat brown rice instead of white, and tries to limit candy. He likes [employer], sometimes eats while driving or at work, but tries to keep an eye on this, has cut back Lab Results  Component Value Date   HGBA1C 5.9 (H) 05/14/2019   He was noted to have mild OSA on sleep study in 07/2017.  Some nights are better than others (re: snoring, per wife).  He feels well rested in the mornings, no daytime somnolence.  Allergies,  seasonal.  Not as bad recently, not needing medications.  Used Flonase some in April related to certain flowers.   Immunization History  Administered Date(s) Administered  . Influenza,inj,Quad PF,6+ Mos 05/14/2019  . PFIZER SARS-COV-2 Vaccination 11/22/2019, 12/20/2019  . Td 04/25/2017  . Tdap 03/26/2007   Last colonoscopy: never  Last PSA: never  Ophtho: yearly Dentist: regularly, 2x/year Exercise: Using [employer] since 10/2019--biking, yoga Uses it daily, 50 minutes (combo of bike, stretching and strength or yoga).  Weight training 2x/week. Bikes daily (7-20 miles) Playing pickleball, does some walks with the [employer]   PMH, PSH, SH and FH were reviewed and updated  Outpatient Encounter Medications as of 05/26/2020  Medication Sig Note  . fluticasone (FLONASE) 50 MCG/ACT nasal spray Place 2 sprays into both nostrils daily. (Patient not taking: Reported on 05/26/2020)   . ibuprofen (ADVIL,MOTRIN) 400 MG tablet Take 400 mg by mouth 2 (two) times daily. (Patient not taking: Reported on 05/26/2020) 05/26/2020: As needed  . meloxicam (MOBIC) 15 MG tablet TAKE 1 TABLET BY MOUTH EVERY DAY (Patient not taking: Reported on 05/26/2020) 05/26/2020: As needed  . metaxalone (SKELAXIN) 800 MG tablet TAKE 1/2-1 TABLET (400-800 MG TOTAL) BY MOUTH 3 (THREE) TIMES DAILY AS NEEDED FOR MUSCLE SPASMS. (Patient not taking: Reported on 05/26/2020) 05/26/2020: As needed  . [DISCONTINUED] levocetirizine (XYZAL) 5 MG tablet Take 5 mg by mouth every evening. 05/07/2018: Takes daily prn for allergies  . [DISCONTINUED] scopolamine (TRANSDERM-SCOP, 1.5  MG,) 1 MG/3DAYS Place 1 patch (1.5 mg total) onto the skin every 3 (three) days.    No facility-administered encounter medications on file as of 05/26/2020.   No Known Allergies   ROS: The patient denies anorexia, fever, headaches, vision loss, decreased hearing, ear pain, hoarseness, chest pain, palpitations, dizziness, syncope, dyspnea on exertion, cough, swelling, nausea,  vomiting, diarrhea, constipation, abdominal pain, melena, hematochezia, hematuria, incontinence, erectile dysfunction, nocturia,weakened urine stream, dysuria, joint pains, numbness, tingling, weakness, tremor, suspicious skin lesions, depression, anxiety, abnormal bleeding/bruising, or enlarged lymph nodes  Rare hearburn with spicy/tomato-based foods. Seasonal allergies in spring/fall Low back pain/stiffness per HPI Mild snoring, no unrefreshed sleep or daytime somnolence   PHYSICAL EXAM:  BP 104/70   Pulse 72   Ht 6' 2.5" (1.892 m)   Wt 216 lb (98 kg)   BMI 27.36 kg/m   Wt Readings from Last 3 Encounters:  05/26/20 216 lb (98 kg)  05/14/19 219 lb 9.6 oz (99.6 kg)  10/09/18 224 lb (101.6 kg)    General Appearance:  Alert, cooperative, no distress, appears stated age   Head:  Normocephalic, without obvious abnormality, atraumatic   Eyes:  PERRL, conjunctiva/corneas clear, EOM's intact, fundi benign   Ears:  Normal TM's and external ear canals   Nose:  Not examined, wearing mask due to COVID-19 pandemic  Throat:  Not examined, wearing mask due to COVID-19 pandemic  Neck:  Supple, no lymphadenopathy; thyroid: no enlargement/tenderness/ nodules; no carotid bruit or JVD   Back:  Spine nontender, no curvature, ROM normal, no CVA tenderness   Lungs:  Clear to auscultation bilaterally without wheezes, rales or ronchi; respirations unlabored   Chest Wall:  No tenderness or deformity   Heart:  Regular rate and rhythm, S1 and S2 normal, no murmur, rub or gallop   Breast Exam: No chest wall tenderness, masses or gynecomastia   Abdomen:  Soft, non-tender, nondistended, normoactive bowel sounds,  no masses, no hepatosplenomegaly   Genitalia:  Normal male external genitalia without lesions. Testicles without masses. No inguinal hernias.There are nontender, mobile cysts infero-posterior to the testicleon the right, unchanged. No inguinal lymphadenopathy  Rectal:   Normal sphincter tone, no masses or tenderness; guaiac negative stool. Prostate smooth, no nodules, not enlarged.   Extremities:  No clubbing, cyanosis or edema   Pulses:  2+ and symmetric all extremities   Skin:  Skin color, texture, turgor normal, no rashes or lesions.   Lymph nodes:  Cervical, supraclavicular, and axillary nodes normal   Neurologic:  CNII-XII intact, normal strength, sensation and gait; reflexes 2+ and symmetric throughout    Psych: Normal mood, affect, hygiene and grooming  Lab Results  Component Value Date   HGBA1C 5.6 05/26/2020    ASSESSMENT/PLAN:  Annual physical exam - Plan: POCT Urinalysis DIP (Proadvantage Device), Lipid panel, Comprehensive metabolic panel, CBC with Differential/Platelet, PSA  Impaired fasting glucose - Improved. Reviewed proper diet, encouraged further weight lost (at waist/abd); continue regular exercise - Plan: HgB A1c, Comprehensive metabolic panel  Allergic rhinitis, unspecified seasonality, unspecified trigger  Mild obstructive sleep apnea - encouraged to avoid sleeping on back. No significant symptoms currently, has lost weight. Advise [place] if unrefreshed sleep, daytime somnolence develops  Pure hypercholesterolemia - Due for recheck. Diet has improved. Discussed the "minimal"atherosclerotic changes, and potential for statin in future. Recheck lipids today - Plan: Lipid panel  Need for influenza vaccination - Plan: Flu Vaccine QUAD 6+ mos PF IM (Fluarix Quad PF)  Chronic midline low back pain without sciatica -  Encouraged core strengthening, stretching, as he has been doing. Use NSAIDs and muscle relaxants sparingly. Check Cr  Screen for colon cancer - refer to GI for colonoscopy - Plan: Ambulatory referral to Gastroenterology  Screening for prostate cancer - counseled re: screening for prostate cancer with PSAs - Plan: PSA    Risks/benefits of PSA screening reviewed, done today. Recommended at least 30  minutes of aerobic activity at least 5 days/week, weight-bearing exercise 2x/week; Monthly self-testicular exams. Counseled regarding sunscreen, carbon monoxide detectors, changing batteries in smoke detectors, safe driving.  Immunization recommendationsdiscussed--flu shot given today. Shingrix recommended, risks/SE reviewed, to check insurance and schedule [place] when convenient.Discussed COVID boosters. Colon cancer screening is due now.  Referred to GI  F/u 1 year, sooner prn

## 2020-05-25 NOTE — Patient Instructions (Signed)
  HEALTH MAINTENANCE RECOMMENDATIONS:  It is recommended that you get at least 30 minutes of aerobic exercise at least 5 days/week (for weight loss, you may need as much as 60-90 minutes). This can be any activity that gets your heart rate up. This can be divided in 10-15 minute intervals if needed, but try and build up your endurance at least once a week.  Weight bearing exercise is also recommended twice weekly.  Eat a healthy diet with lots of vegetables, fruits and fiber.  "Colorful" foods have a lot of vitamins (ie green vegetables, tomatoes, red peppers, etc).  Limit sweet tea, regular sodas and alcoholic beverages, all of which has a lot of calories and sugar.  Up to 2 alcoholic drinks daily may be beneficial for men (unless trying to lose weight, watch sugars).  Drink a lot of water.  Sunscreen of at least SPF 30 should be used on all sun-exposed parts of the skin when outside between the hours of 10 am and 4 pm (not just when at beach or pool, but even with exercise, golf, tennis, and yard work!)  Use a sunscreen that says "broad spectrum" so it covers both UVA and UVB rays, and make sure to reapply every 1-2 hours.  Remember to change the batteries in your smoke detectors when changing your clock times in the spring and fall. Carbon monoxide detectors are recommended for your home.  Use your seat belt every time you are in a car, and please drive safely and not be distracted with cell phones and texting while driving.  I recommend getting the new shingles vaccine (Shingrix). You will need to check with your insurance to see if it is covered, and if covered, schedule a nurse visit when convenient. It is a series of 2 injections, spaced 2 months apart.  Any vaccines should be separated by 2-4 weeks (or given the same day).  Current recommendations are for COVID boosters 8 months after the 2nd.  Pay attention to the news, as these guidelines may change.  We are referring you to GI for  routine screening colonoscopy.

## 2020-05-26 ENCOUNTER — Ambulatory Visit (INDEPENDENT_AMBULATORY_CARE_PROVIDER_SITE_OTHER): Payer: 59 | Admitting: Family Medicine

## 2020-05-26 ENCOUNTER — Encounter: Payer: Self-pay | Admitting: Family Medicine

## 2020-05-26 ENCOUNTER — Other Ambulatory Visit: Payer: Self-pay

## 2020-05-26 VITALS — BP 104/70 | HR 72 | Ht 74.5 in | Wt 216.0 lb

## 2020-05-26 DIAGNOSIS — M545 Low back pain: Secondary | ICD-10-CM

## 2020-05-26 DIAGNOSIS — R7301 Impaired fasting glucose: Secondary | ICD-10-CM

## 2020-05-26 DIAGNOSIS — Z23 Encounter for immunization: Secondary | ICD-10-CM | POA: Diagnosis not present

## 2020-05-26 DIAGNOSIS — G4733 Obstructive sleep apnea (adult) (pediatric): Secondary | ICD-10-CM | POA: Diagnosis not present

## 2020-05-26 DIAGNOSIS — J309 Allergic rhinitis, unspecified: Secondary | ICD-10-CM

## 2020-05-26 DIAGNOSIS — Z125 Encounter for screening for malignant neoplasm of prostate: Secondary | ICD-10-CM

## 2020-05-26 DIAGNOSIS — E7800 Pure hypercholesterolemia, unspecified: Secondary | ICD-10-CM

## 2020-05-26 DIAGNOSIS — G8929 Other chronic pain: Secondary | ICD-10-CM

## 2020-05-26 DIAGNOSIS — Z1211 Encounter for screening for malignant neoplasm of colon: Secondary | ICD-10-CM

## 2020-05-26 DIAGNOSIS — Z0000 Encounter for general adult medical examination without abnormal findings: Secondary | ICD-10-CM | POA: Diagnosis not present

## 2020-05-26 LAB — POCT URINALYSIS DIP (PROADVANTAGE DEVICE)
Bilirubin, UA: NEGATIVE
Blood, UA: NEGATIVE
Glucose, UA: NEGATIVE mg/dL
Ketones, POC UA: NEGATIVE mg/dL
Leukocytes, UA: NEGATIVE
Nitrite, UA: NEGATIVE
Specific Gravity, Urine: 1.03
Urobilinogen, Ur: NEGATIVE
pH, UA: 5.5 (ref 5.0–8.0)

## 2020-05-26 LAB — POCT GLYCOSYLATED HEMOGLOBIN (HGB A1C): Hemoglobin A1C: 5.6 % (ref 4.0–5.6)

## 2020-05-27 LAB — COMPREHENSIVE METABOLIC PANEL
ALT: 14 IU/L (ref 0–44)
AST: 17 IU/L (ref 0–40)
Albumin/Globulin Ratio: 2 (ref 1.2–2.2)
Albumin: 4.8 g/dL (ref 4.0–5.0)
Alkaline Phosphatase: 79 IU/L (ref 48–121)
BUN/Creatinine Ratio: 18 (ref 9–20)
BUN: 22 mg/dL (ref 6–24)
Bilirubin Total: 0.6 mg/dL (ref 0.0–1.2)
CO2: 26 mmol/L (ref 20–29)
Calcium: 9.7 mg/dL (ref 8.7–10.2)
Chloride: 101 mmol/L (ref 96–106)
Creatinine, Ser: 1.25 mg/dL (ref 0.76–1.27)
GFR calc Af Amer: 77 mL/min/{1.73_m2} (ref >59)
GFR calc non Af Amer: 67 mL/min/{1.73_m2} (ref >59)
Globulin, Total: 2.4 g/dL (ref 1.5–4.5)
Glucose: 110 mg/dL — ABNORMAL HIGH (ref 65–99)
Potassium: 4.8 mmol/L (ref 3.5–5.2)
Sodium: 140 mmol/L (ref 134–144)
Total Protein: 7.2 g/dL (ref 6.0–8.5)

## 2020-05-27 LAB — CBC WITH DIFFERENTIAL/PLATELET
Basophils Absolute: 0 10*3/uL (ref 0.0–0.2)
Basos: 0 %
EOS (ABSOLUTE): 0.1 10*3/uL (ref 0.0–0.4)
Eos: 1 %
Hematocrit: 42.4 % (ref 37.5–51.0)
Hemoglobin: 14.5 g/dL (ref 13.0–17.7)
Immature Grans (Abs): 0 10*3/uL (ref 0.0–0.1)
Immature Granulocytes: 0 %
Lymphocytes Absolute: 1.6 10*3/uL (ref 0.7–3.1)
Lymphs: 30 %
MCH: 32.9 pg (ref 26.6–33.0)
MCHC: 34.2 g/dL (ref 31.5–35.7)
MCV: 96 fL (ref 79–97)
Monocytes Absolute: 0.4 10*3/uL (ref 0.1–0.9)
Monocytes: 7 %
Neutrophils Absolute: 3.3 10*3/uL (ref 1.4–7.0)
Neutrophils: 62 %
Platelets: 222 10*3/uL (ref 150–450)
RBC: 4.41 x10E6/uL (ref 4.14–5.80)
RDW: 12.7 % (ref 11.6–15.4)
WBC: 5.4 10*3/uL (ref 3.4–10.8)

## 2020-05-27 LAB — LIPID PANEL
Chol/HDL Ratio: 3.4 ratio (ref 0.0–5.0)
Cholesterol, Total: 198 mg/dL (ref 100–199)
HDL: 58 mg/dL (ref >39)
LDL Chol Calc (NIH): 126 mg/dL — ABNORMAL HIGH (ref 0–99)
Triglycerides: 80 mg/dL (ref 0–149)
VLDL Cholesterol Cal: 14 mg/dL (ref 5–40)

## 2020-05-27 LAB — PSA: Prostate Specific Ag, Serum: 0.5 ng/mL (ref 0.0–4.0)

## 2020-06-08 ENCOUNTER — Encounter: Payer: Self-pay | Admitting: Gastroenterology

## 2020-07-04 ENCOUNTER — Telehealth: Payer: Self-pay | Admitting: Family Medicine

## 2020-07-04 NOTE — Telephone Encounter (Signed)
Form was dropped off, I filled out and put in a folder on your shelf. Can you please sign and I will fax back? Thanks.

## 2020-07-04 NOTE — Telephone Encounter (Signed)
Pt's wife came in and dropped off form. Sending to [place] to be completed.

## 2020-07-04 NOTE — Telephone Encounter (Signed)
Signed/in red folder

## 2020-07-21 ENCOUNTER — Other Ambulatory Visit: Payer: Self-pay

## 2020-07-21 ENCOUNTER — Ambulatory Visit (AMBULATORY_SURGERY_CENTER): Payer: Self-pay

## 2020-07-21 ENCOUNTER — Encounter: Payer: 59 | Admitting: Gastroenterology

## 2020-07-21 VITALS — Ht 74.5 in | Wt 225.0 lb

## 2020-07-21 DIAGNOSIS — Z1211 Encounter for screening for malignant neoplasm of colon: Secondary | ICD-10-CM

## 2020-07-21 MED ORDER — PLENVU 140 G PO SOLR: 1.0000 | ORAL | 0 refills | Status: DC

## 2020-07-21 NOTE — Progress Notes (Signed)
No egg or soy allergy known to patient  No issues with past sedation with any surgeries or procedures No intubation problems in the past  No FH of Malignant Hyperthermia No diet pills per patient No home 02 use per patient  No blood thinners per patient  Pt denies issues with constipation  No A fib or A flutter  EMMI video via MyChart  COVID 19 guidelines implemented in [place] today with Pt and RN  Coupon given to pt in [place] today , Code to Pharmacy  COVID vaccines completed on 11/2019 per pt;  Due to the COVID-19 pandemic we are asking patients to follow these guidelines. Please only bring one care partner. Please be aware that your care partner may wait in the car in the parking lot or if they feel like they will be too hot to wait in the car, they may wait in the lobby on the 4th floor. All care partners are required to wear a mask the entire time (we do not have any that we can provide them), they need to practice social distancing, and we will do a Covid check for all patient's and care partners when you arrive. Also we will check their temperature and your temperature. If the care partner waits in their car they need to stay in the parking lot the entire time and we will call them on their cell phone when the patient is ready for discharge so they can bring the car to the front of the building. Also all patient's will need to wear a mask into building.  

## 2020-07-22 ENCOUNTER — Other Ambulatory Visit (INDEPENDENT_AMBULATORY_CARE_PROVIDER_SITE_OTHER): Payer: 59

## 2020-07-22 DIAGNOSIS — Z23 Encounter for immunization: Secondary | ICD-10-CM | POA: Diagnosis not present

## 2020-07-26 ENCOUNTER — Encounter: Payer: Self-pay | Admitting: Gastroenterology

## 2020-08-07 ENCOUNTER — Other Ambulatory Visit: Payer: Self-pay | Admitting: Family Medicine

## 2020-08-07 DIAGNOSIS — M62830 Muscle spasm of back: Secondary | ICD-10-CM

## 2020-08-08 NOTE — Telephone Encounter (Signed)
Is this okay to refill? 

## 2020-08-09 ENCOUNTER — Other Ambulatory Visit: Payer: Self-pay

## 2020-08-09 ENCOUNTER — Ambulatory Visit (INDEPENDENT_AMBULATORY_CARE_PROVIDER_SITE_OTHER): Payer: 59

## 2020-08-09 ENCOUNTER — Ambulatory Visit: Payer: 59

## 2020-08-09 DIAGNOSIS — Z23 Encounter for immunization: Secondary | ICD-10-CM | POA: Diagnosis not present

## 2020-08-15 ENCOUNTER — Other Ambulatory Visit: Payer: Self-pay

## 2020-08-15 ENCOUNTER — Encounter: Payer: Self-pay | Admitting: Gastroenterology

## 2020-08-15 ENCOUNTER — Ambulatory Visit (AMBULATORY_SURGERY_CENTER): Payer: 59 | Admitting: Gastroenterology

## 2020-08-15 VITALS — BP 113/71 | HR 64 | Temp 98.0°F | Resp 14 | Ht 74.0 in | Wt 225.0 lb

## 2020-08-15 DIAGNOSIS — Z1211 Encounter for screening for malignant neoplasm of colon: Secondary | ICD-10-CM | POA: Diagnosis present

## 2020-08-15 MED ORDER — SODIUM CHLORIDE 0.9 % IV SOLN: 500.0000 mL | Freq: Once | INTRAVENOUS | Status: DC

## 2020-08-15 NOTE — Progress Notes (Signed)
A/ox3, pleased with MAC, report to RN 

## 2020-08-15 NOTE — Progress Notes (Signed)
Pt's states no medical or surgical changes since previsit or office visit. VS by CW. 

## 2020-08-15 NOTE — Op Note (Signed)
[facility] Patient Name: Gregory Wright Procedure Date: 08/15/2020 9:17 AM MRN: [id number] Endoscopist: Sherilyn Cooter L. Myrtie Neither , MD Age: 49 Referring MD:  Date of Birth: 01-12-1971 Gender: Male Account #: [id number] Procedure:                Colonoscopy Indications:              Screening for colorectal malignant neoplasm, This                            is the patient's first colonoscopy Medicines:                Monitored Anesthesia Care Procedure:                Pre-Anesthesia Assessment:                           - Prior to the procedure, a History and Physical                            was performed, and patient medications and                            allergies were reviewed. The patient's tolerance of                            previous anesthesia was also reviewed. The risks                            and benefits of the procedure and the sedation                            options and risks were discussed with the patient.                            All questions were answered, and informed consent                            was obtained. Prior Anticoagulants: The patient has                            taken no previous anticoagulant or antiplatelet                            agents. ASA Grade Assessment: II - A patient with                            mild systemic disease. After reviewing the risks                            and benefits, the patient was deemed in                            satisfactory condition to undergo the procedure.  After obtaining informed consent, the colonoscope                            was passed under direct vision. Throughout the                            procedure, the patient's blood pressure, pulse, and                            oxygen saturations were monitored continuously. The                            Colonoscope was introduced through the anus and                            advanced to the the cecum,  identified by                            appendiceal orifice and ileocecal valve. The                            colonoscopy was performed without difficulty. The                            patient tolerated the procedure well. The quality                            of the bowel preparation was excellent. The                            ileocecal valve, appendiceal orifice, and rectum                            were photographed. Scope In: 9:27:36 AM Scope Out: 9:38:00 AM Scope Withdrawal Time: 0 hours 8 minutes 22 seconds  Total Procedure Duration: 0 hours 10 minutes 24 seconds  Findings:                 The perianal and digital rectal examinations were                            normal.                           Multiple small-mouthed diverticula were found in                            the left colon.                           The exam was otherwise without abnormality on                            direct and retroflexion views. Complications:            No immediate complications. Estimated Blood Loss:  Estimated blood loss: none. Impression:               - Diverticulosis in the left colon.                           - The examination was otherwise normal on direct                            and retroflexion views.                           - No specimens collected. Recommendation:           - Patient has a contact number available for                            emergencies. The signs and symptoms of potential                            delayed complications were discussed with the                            patient. Return to normal activities tomorrow.                            Written discharge instructions were provided to the                            patient.                           - Resume previous diet.                           - Continue present medications.                           - Repeat colonoscopy in 10 years for screening                             purposes. Xylia Scherger L. Myrtie Neither, MD 08/15/2020 9:46:41 AM This report has been signed electronically.

## 2020-08-15 NOTE — Patient Instructions (Signed)
HANDOUTS PROVIDED ON: DIVERTICULOSIS  You may resume your previous diet and medication schedule.  Thank you for allowing [place] to care for you today!!!   YOU HAD AN ENDOSCOPIC PROCEDURE TODAY AT THE [facility]:   Refer to the procedure report that was given to you for any specific questions about what was found during the examination.  If the procedure report does not answer your questions, please call your gastroenterologist to clarify.  If you requested that your care partner not be given the details of your procedure findings, then the procedure report has been included in a sealed envelope for you to review at your convenience later.  YOU SHOULD EXPECT: Some feelings of bloating in the abdomen. Passage of more gas than usual.  Walking can help get rid of the air that was put into your GI tract during the procedure and reduce the bloating. If you had a lower endoscopy (such as a colonoscopy or flexible sigmoidoscopy) you may notice spotting of blood in your stool or on the toilet paper. If you underwent a bowel prep for your procedure, you may not have a normal bowel movement for a few days.  Please Note:  You might notice some irritation and congestion in your nose or some drainage.  This is from the oxygen used during your procedure.  There is no need for concern and it should clear up in a day or so.  SYMPTOMS TO REPORT IMMEDIATELY:   Following lower endoscopy (colonoscopy or flexible sigmoidoscopy):  Excessive amounts of blood in the stool  Significant tenderness or worsening of abdominal pains  Swelling of the abdomen that is new, acute  Fever of 100F or higher  For urgent or emergent issues, a gastroenterologist can be reached at any hour by calling [phone]. Do not use MyChart messaging for urgent concerns.    DIET:  We do recommend a small meal at first, but then you may proceed to your regular diet.  Drink plenty of fluids but you should avoid alcoholic  beverages for 24 hours.  ACTIVITY:  You should plan to take it easy for the rest of today and you should NOT DRIVE or use heavy machinery until tomorrow (because of the sedation medicines used during the test).    FOLLOW UP: Our staff will call the number listed on your records Wednesday morning between 7:15 am and 8:15 am to check on you and address any questions or concerns that you may have regarding the information given to you following your procedure. If we do not reach you, we will leave a message.  We will attempt to reach you two times.  During this call, we will ask if you have developed any symptoms of COVID 19. If you develop any symptoms (ie: fever, flu-like symptoms, shortness of breath, cough etc.) before then, please call [phone].  If you test positive for Covid 19 in the 2 weeks post procedure, please call and report this information to [place].    If any biopsies were taken you will be contacted by phone or by letter within the next 1-3 weeks.  Please call us at [phone] if you have not heard about the biopsies in 3 weeks.    SIGNATURES/CONFIDENTIALITY: You and/or your care partner have signed paperwork which will be entered into your electronic medical record.  These signatures attest to the fact that that the information above on your After Visit Summary has been reviewed and is understood.  Full responsibility of the  confidentiality of this discharge information lies with you and/or your care-partner. 

## 2020-08-16 ENCOUNTER — Telehealth: Payer: Self-pay | Admitting: *Deleted

## 2020-08-16 NOTE — Telephone Encounter (Signed)
1. Have you developed a fever since your procedure? no  2.   Have you had an respiratory symptoms (SOB or cough) since your procedure? no  3.   Have you tested positive for COVID 19 since your procedure no  4.   Have you had any family members/close contacts diagnosed with the COVID 19 since your procedure?  no   If yes to any of these questions please route to Laverna Peace, RN and Karlton Lemon, RN Follow up Call-  Call back number 08/15/2020  Post procedure Call Back phone  # [phone]  Permission to leave phone message Yes  Some recent data might be hidden     Patient questions:  Do you have a fever, pain , or abdominal swelling? No. Pain Score  0 *  Have you tolerated food without any problems? Yes.    Have you been able to return to your normal activities? Yes.    Do you have any questions about your discharge instructions: Diet   No. Medications  No. Follow up visit  No.  Do you have questions or concerns about your Care? No.  Actions: * If pain score is 4 or above: No action needed, pain <4.

## 2020-08-21 ENCOUNTER — Emergency Department (HOSPITAL_COMMUNITY): Payer: 59

## 2020-08-21 ENCOUNTER — Emergency Department (HOSPITAL_COMMUNITY)
Admission: EM | Admit: 2020-08-21 | Discharge: 2020-08-21 | Disposition: A | Discharge status: Home / Self Care | Payer: 59 | Attending: Emergency Medicine | Admitting: Emergency Medicine

## 2020-08-21 DIAGNOSIS — T59811A Toxic effect of smoke, accidental (unintentional), initial encounter: Secondary | ICD-10-CM | POA: Insufficient documentation

## 2020-08-21 DIAGNOSIS — X000XXA Exposure to flames in uncontrolled fire in building or structure, initial encounter: Secondary | ICD-10-CM | POA: Diagnosis not present

## 2020-08-21 LAB — CARBOXYHEMOGLOBIN - COOX: Carboxyhemoglobin: 1.5 % (ref 0.5–1.5)
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
## 2020-08-21 NOTE — ED Notes (Signed)
Patient ambulated to and from restroom with steady gait. NAD noted at this time. Male visitor remains at bedside. Patient denies further needs.

## 2020-08-21 NOTE — ED Notes (Signed)
RT made aware of need for arterial stick.

## 2020-08-21 NOTE — ED Notes (Signed)
Patient's neighbor, Mellody Dance, in room visiting. Patient remains AAOx4, speech clear and appropriate, respirations even and nonlabored. NAD noted.

## 2020-08-21 NOTE — ED Notes (Signed)
Pt discharge instructions reviewed with the patient. The patient verbalized understanding. Pt discharged. 

## 2020-08-21 NOTE — Discharge Instructions (Addendum)
Return if you are having any problems. 
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
## 2020-08-22 ENCOUNTER — Other Ambulatory Visit: Payer: Self-pay

## 2020-08-22 ENCOUNTER — Encounter: Payer: Self-pay | Admitting: Family Medicine

## 2020-08-22 ENCOUNTER — Ambulatory Visit (INDEPENDENT_AMBULATORY_CARE_PROVIDER_SITE_OTHER): Payer: 59 | Admitting: Family Medicine

## 2020-08-22 VITALS — BP 130/90 | HR 80 | Ht 74.25 in | Wt 218.8 lb

## 2020-08-22 DIAGNOSIS — G8929 Other chronic pain: Secondary | ICD-10-CM | POA: Diagnosis not present

## 2020-08-22 DIAGNOSIS — F419 Anxiety disorder, unspecified: Secondary | ICD-10-CM

## 2020-08-22 DIAGNOSIS — M62830 Muscle spasm of back: Secondary | ICD-10-CM

## 2020-08-22 DIAGNOSIS — M545 Low back pain, unspecified: Secondary | ICD-10-CM | POA: Diagnosis not present

## 2020-08-22 MED ORDER — METAXALONE 800 MG PO TABS: ORAL_TABLET | ORAL | 0 refills | Status: DC

## 2020-08-22 MED ORDER — MELOXICAM 15 MG PO TABS: 15.0000 mg | ORAL_TABLET | Freq: Every day | ORAL | 0 refills | Status: DC

## 2020-08-22 MED ORDER — ALPRAZOLAM 0.5 MG PO TABS: 0.2500 mg | ORAL_TABLET | Freq: Three times a day (TID) | ORAL | 0 refills | Status: DC | PRN
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
## 2020-09-21 ENCOUNTER — Other Ambulatory Visit: Payer: Self-pay

## 2020-09-21 ENCOUNTER — Other Ambulatory Visit (INDEPENDENT_AMBULATORY_CARE_PROVIDER_SITE_OTHER): Payer: 59

## 2020-09-21 DIAGNOSIS — Z23 Encounter for immunization: Secondary | ICD-10-CM | POA: Diagnosis not present

## 2020-09-21 DIAGNOSIS — Z9889 Other specified postprocedural states: Secondary | ICD-10-CM

## 2020-09-21 HISTORY — DX: Other specified postprocedural states: Z98.890

## 2020-09-28 ENCOUNTER — Encounter: Payer: Self-pay | Admitting: *Deleted

## 2020-10-10 ENCOUNTER — Other Ambulatory Visit: Payer: Self-pay | Admitting: Family Medicine

## 2020-10-10 DIAGNOSIS — F419 Anxiety disorder, unspecified: Secondary | ICD-10-CM
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
## 2020-10-11 NOTE — Telephone Encounter (Signed)
Ok to refill 

## 2020-10-11 NOTE — Telephone Encounter (Signed)
Left message for pt to call me back 

## 2020-10-12 NOTE — Telephone Encounter (Signed)
Pt states he usually takes it 1-2 times a week. Once during week and then on the weekend for sleep. He also does take a melatonin as well. Please advise

## 2020-10-21 ENCOUNTER — Encounter: Payer: Self-pay | Admitting: Family Medicine
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
## 2021-06-05 ENCOUNTER — Ambulatory Visit (INDEPENDENT_AMBULATORY_CARE_PROVIDER_SITE_OTHER): Payer: 59 | Admitting: Family Medicine

## 2021-06-05 ENCOUNTER — Other Ambulatory Visit: Payer: Self-pay

## 2021-06-05 ENCOUNTER — Encounter: Payer: Self-pay | Admitting: Family Medicine

## 2021-06-05 VITALS — BP 110/76 | HR 80 | Ht 74.0 in | Wt 217.8 lb

## 2021-06-05 DIAGNOSIS — Z0000 Encounter for general adult medical examination without abnormal findings: Secondary | ICD-10-CM | POA: Diagnosis not present

## 2021-06-05 DIAGNOSIS — E7800 Pure hypercholesterolemia, unspecified: Secondary | ICD-10-CM

## 2021-06-05 DIAGNOSIS — F419 Anxiety disorder, unspecified: Secondary | ICD-10-CM

## 2021-06-05 DIAGNOSIS — Z125 Encounter for screening for malignant neoplasm of prostate: Secondary | ICD-10-CM

## 2021-06-05 DIAGNOSIS — R7301 Impaired fasting glucose: Secondary | ICD-10-CM | POA: Diagnosis not present

## 2021-06-05 DIAGNOSIS — Z23 Encounter for immunization: Secondary | ICD-10-CM | POA: Diagnosis not present

## 2021-06-05 DIAGNOSIS — Z1159 Encounter for screening for other viral diseases: Secondary | ICD-10-CM

## 2021-06-05 LAB — POCT URINALYSIS DIP (PROADVANTAGE DEVICE)
Blood, UA: NEGATIVE
Glucose, UA: NEGATIVE mg/dL
Nitrite, UA: NEGATIVE
Protein Ur, POC: 30 mg/dL — AB
Specific Gravity, Urine: 1.03
Urobilinogen, Ur: NEGATIVE
pH, UA: 6 (ref 5.0–8.0)

## 2021-06-05 LAB — POCT GLYCOSYLATED HEMOGLOBIN (HGB A1C): Hemoglobin A1C: 5.8 % — AB (ref 4.0–5.6)

## 2021-06-06 LAB — COMPREHENSIVE METABOLIC PANEL
ALT: 15 IU/L (ref 0–44)
AST: 13 IU/L (ref 0–40)
Albumin/Globulin Ratio: 2.1 (ref 1.2–2.2)
Albumin: 5 g/dL — ABNORMAL HIGH (ref 3.8–4.9)
Alkaline Phosphatase: 83 IU/L (ref 44–121)
BUN/Creatinine Ratio: 15 (ref 9–20)
BUN: 19 mg/dL (ref 6–24)
Bilirubin Total: 0.5 mg/dL (ref 0.0–1.2)
CO2: 22 mmol/L (ref 20–29)
Calcium: 9.9 mg/dL (ref 8.7–10.2)
Chloride: 101 mmol/L (ref 96–106)
Creatinine, Ser: 1.23 mg/dL (ref 0.76–1.27)
Globulin, Total: 2.4 g/dL (ref 1.5–4.5)
Glucose: 114 mg/dL — ABNORMAL HIGH (ref 65–99)
Potassium: 4.5 mmol/L (ref 3.5–5.2)
Sodium: 138 mmol/L (ref 134–144)
Total Protein: 7.4 g/dL (ref 6.0–8.5)
eGFR: 71 mL/min/{1.73_m2} (ref >59)

## 2021-06-06 LAB — LIPID PANEL
Chol/HDL Ratio: 3.5 ratio (ref 0.0–5.0)
Cholesterol, Total: 218 mg/dL — ABNORMAL HIGH (ref 100–199)
HDL: 62 mg/dL (ref >39)
LDL Chol Calc (NIH): 138 mg/dL — ABNORMAL HIGH (ref 0–99)
Triglycerides: 100 mg/dL (ref 0–149)
VLDL Cholesterol Cal: 18 mg/dL (ref 5–40)

## 2021-06-06 LAB — CBC WITH DIFFERENTIAL/PLATELET
Basophils Absolute: 0 10*3/uL (ref 0.0–0.2)
Basos: 1 %
EOS (ABSOLUTE): 0.1 10*3/uL (ref 0.0–0.4)
Eos: 2 %
Hematocrit: 45 % (ref 37.5–51.0)
Hemoglobin: 15.5 g/dL (ref 13.0–17.7)
Immature Grans (Abs): 0 10*3/uL (ref 0.0–0.1)
Immature Granulocytes: 0 %
Lymphocytes Absolute: 1.8 10*3/uL (ref 0.7–3.1)
Lymphs: 28 %
MCH: 32.2 pg (ref 26.6–33.0)
MCHC: 34.4 g/dL (ref 31.5–35.7)
MCV: 94 fL (ref 79–97)
Monocytes Absolute: 0.5 10*3/uL (ref 0.1–0.9)
Monocytes: 8 %
Neutrophils Absolute: 4.1 10*3/uL (ref 1.4–7.0)
Neutrophils: 61 %
Platelets: 273 10*3/uL (ref 150–450)
RBC: 4.81 x10E6/uL (ref 4.14–5.80)
RDW: 11.6 % (ref 11.6–15.4)
WBC: 6.5 10*3/uL (ref 3.4–10.8)

## 2021-06-06 LAB — HEPATITIS C ANTIBODY: Hep C Virus Ab: <0.1 s/co ratio (ref 0.0–0.9)

## 2021-06-06 LAB — PSA: Prostate Specific Ag, Serum: 0.6 ng/mL (ref 0.0–4.0)

## 2021-07-28 ENCOUNTER — Telehealth: Payer: Self-pay | Admitting: Family Medicine

## 2021-07-28 NOTE — Telephone Encounter (Signed)
Pt dropped off a physical form to be completed. Form placed in your basket on your desk.

## 2021-07-31 NOTE — Telephone Encounter (Signed)
Done, faxed back and patient notified.
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
## 2022-05-30 ENCOUNTER — Encounter: Payer: Self-pay | Admitting: Internal Medicine
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
## 2022-06-12 NOTE — Patient Instructions (Incomplete)
  HEALTH MAINTENANCE RECOMMENDATIONS:  It is recommended that you get at least 30 minutes of aerobic exercise at least 5 days/week (for weight loss, you may need as much as 60-90 minutes). This can be any activity that gets your heart rate up. This can be divided in 10-15 minute intervals if needed, but try and build up your endurance at least once a week.  Weight bearing exercise is also recommended twice weekly.  Eat a healthy diet with lots of vegetables, fruits and fiber.  "Colorful" foods have a lot of vitamins (ie green vegetables, tomatoes, red peppers, etc).  Limit sweet tea, regular sodas and alcoholic beverages, all of which has a lot of calories and sugar.  Up to 2 alcoholic drinks daily may be beneficial for men (unless trying to lose weight, watch sugars).  Drink a lot of water.  Sunscreen of at least SPF 30 should be used on all sun-exposed parts of the skin when outside between the hours of 10 am and 4 pm (not just when at beach or pool, but even with exercise, golf, tennis, and yard work!)  Use a sunscreen that says "broad spectrum" so it covers both UVA and UVB rays, and make sure to reapply every 1-2 hours.  Remember to change the batteries in your smoke detectors when changing your clock times in the spring and fall.  Carbon monoxide detectors are recommended for your home.  Use your seat belt every time you are in a car, and please drive safely and not be distracted with cell phones and texting while driving.  Get the updated COVID booster when it becomes available--you should wait at least 2 weeks from today's flu shot.  Try and cut back on the Delta 8. Use more holistic approaches to sleep, since there won't be work stress. Don't increase your alcohol intake during the week, which negatively impacts your sleep.

## 2022-06-13 ENCOUNTER — Encounter: Payer: Self-pay | Admitting: Family Medicine

## 2022-06-13 ENCOUNTER — Ambulatory Visit (INDEPENDENT_AMBULATORY_CARE_PROVIDER_SITE_OTHER): Payer: 59 | Admitting: Family Medicine

## 2022-06-13 VITALS — BP 118/74 | HR 64 | Ht 74.5 in | Wt 214.6 lb

## 2022-06-13 DIAGNOSIS — Z23 Encounter for immunization: Secondary | ICD-10-CM | POA: Diagnosis not present

## 2022-06-13 DIAGNOSIS — M545 Low back pain, unspecified: Secondary | ICD-10-CM

## 2022-06-13 DIAGNOSIS — R7301 Impaired fasting glucose: Secondary | ICD-10-CM

## 2022-06-13 DIAGNOSIS — Z0000 Encounter for general adult medical examination without abnormal findings: Secondary | ICD-10-CM

## 2022-06-13 DIAGNOSIS — G4700 Insomnia, unspecified: Secondary | ICD-10-CM

## 2022-06-13 DIAGNOSIS — Z125 Encounter for screening for malignant neoplasm of prostate: Secondary | ICD-10-CM

## 2022-06-13 DIAGNOSIS — E7800 Pure hypercholesterolemia, unspecified: Secondary | ICD-10-CM

## 2022-06-13 DIAGNOSIS — G8929 Other chronic pain: Secondary | ICD-10-CM

## 2022-06-14 LAB — CBC WITH DIFFERENTIAL/PLATELET
Basophils Absolute: 0 10*3/uL (ref 0.0–0.2)
Basos: 1 %
EOS (ABSOLUTE): 0.1 10*3/uL (ref 0.0–0.4)
Eos: 2 %
Hematocrit: 41.3 % (ref 37.5–51.0)
Hemoglobin: 14.3 g/dL (ref 13.0–17.7)
Immature Grans (Abs): 0 10*3/uL (ref 0.0–0.1)
Immature Granulocytes: 0 %
Lymphocytes Absolute: 1.7 10*3/uL (ref 0.7–3.1)
Lymphs: 31 %
MCH: 32.6 pg (ref 26.6–33.0)
MCHC: 34.6 g/dL (ref 31.5–35.7)
MCV: 94 fL (ref 79–97)
Monocytes Absolute: 0.5 10*3/uL (ref 0.1–0.9)
Monocytes: 10 %
Neutrophils Absolute: 3.1 10*3/uL (ref 1.4–7.0)
Neutrophils: 56 %
Platelets: 244 10*3/uL (ref 150–450)
RBC: 4.38 x10E6/uL (ref 4.14–5.80)
RDW: 12.4 % (ref 11.6–15.4)
WBC: 5.5 10*3/uL (ref 3.4–10.8)

## 2022-06-14 LAB — LIPID PANEL
Chol/HDL Ratio: 3.3 ratio (ref 0.0–5.0)
Cholesterol, Total: 215 mg/dL — ABNORMAL HIGH (ref 100–199)
HDL: 66 mg/dL (ref >39)
LDL Chol Calc (NIH): 136 mg/dL — ABNORMAL HIGH (ref 0–99)
Triglycerides: 74 mg/dL (ref 0–149)
VLDL Cholesterol Cal: 13 mg/dL (ref 5–40)

## 2022-06-14 LAB — COMPREHENSIVE METABOLIC PANEL
ALT: 25 IU/L (ref 0–44)
AST: 19 IU/L (ref 0–40)
Albumin/Globulin Ratio: 1.8 (ref 1.2–2.2)
Albumin: 4.8 g/dL (ref 3.8–4.9)
Alkaline Phosphatase: 83 IU/L (ref 44–121)
BUN/Creatinine Ratio: 17 (ref 9–20)
BUN: 21 mg/dL (ref 6–24)
Bilirubin Total: 0.7 mg/dL (ref 0.0–1.2)
CO2: 25 mmol/L (ref 20–29)
Calcium: 9.8 mg/dL (ref 8.7–10.2)
Chloride: 101 mmol/L (ref 96–106)
Creatinine, Ser: 1.24 mg/dL (ref 0.76–1.27)
Globulin, Total: 2.6 g/dL (ref 1.5–4.5)
Glucose: 104 mg/dL — ABNORMAL HIGH (ref 70–99)
Potassium: 4.6 mmol/L (ref 3.5–5.2)
Sodium: 141 mmol/L (ref 134–144)
Total Protein: 7.4 g/dL (ref 6.0–8.5)
eGFR: 70 mL/min/{1.73_m2} (ref >59)

## 2022-06-14 LAB — HEMOGLOBIN A1C
Est. average glucose Bld gHb Est-mCnc: 126 mg/dL
Hgb A1c MFr Bld: 6 % — ABNORMAL HIGH (ref 4.8–5.6)

## 2022-06-14 LAB — PSA: Prostate Specific Ag, Serum: 0.6 ng/mL (ref 0.0–4.0)

## 2022-06-14 LAB — HIV ANTIBODY (ROUTINE TESTING W REFLEX): HIV Screen 4th Generation wRfx: NONREACTIVE

## 2022-07-03 ENCOUNTER — Encounter: Payer: Self-pay | Admitting: Internal Medicine

## 2023-02-19 ENCOUNTER — Other Ambulatory Visit (INDEPENDENT_AMBULATORY_CARE_PROVIDER_SITE_OTHER): Payer: Commercial Managed Care - PPO

## 2023-02-19 DIAGNOSIS — Z23 Encounter for immunization: Secondary | ICD-10-CM | POA: Diagnosis not present

## 2023-03-25 ENCOUNTER — Other Ambulatory Visit (INDEPENDENT_AMBULATORY_CARE_PROVIDER_SITE_OTHER): Payer: Commercial Managed Care - PPO

## 2023-03-25 DIAGNOSIS — Z23 Encounter for immunization: Secondary | ICD-10-CM

## 2023-07-07 NOTE — Patient Instructions (Incomplete)
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
## 2023-07-08 ENCOUNTER — Ambulatory Visit: Payer: Commercial Managed Care - PPO | Admitting: Family Medicine

## 2023-07-08 ENCOUNTER — Encounter: Payer: Self-pay | Admitting: Family Medicine

## 2023-07-08 VITALS — BP 134/80 | HR 60 | Ht 74.5 in | Wt 212.8 lb

## 2023-07-08 DIAGNOSIS — Z0000 Encounter for general adult medical examination without abnormal findings: Secondary | ICD-10-CM

## 2023-07-08 DIAGNOSIS — E7800 Pure hypercholesterolemia, unspecified: Secondary | ICD-10-CM | POA: Diagnosis not present

## 2023-07-08 DIAGNOSIS — Z23 Encounter for immunization: Secondary | ICD-10-CM | POA: Diagnosis not present

## 2023-07-08 DIAGNOSIS — G2581 Restless legs syndrome: Secondary | ICD-10-CM | POA: Diagnosis not present

## 2023-07-08 DIAGNOSIS — R7301 Impaired fasting glucose: Secondary | ICD-10-CM | POA: Diagnosis not present

## 2023-07-08 DIAGNOSIS — Z125 Encounter for screening for malignant neoplasm of prostate: Secondary | ICD-10-CM

## 2023-07-08 LAB — POCT URINALYSIS DIP (PROADVANTAGE DEVICE)
Bilirubin, UA: NEGATIVE
Blood, UA: NEGATIVE
Glucose, UA: NEGATIVE mg/dL
Ketones, POC UA: NEGATIVE mg/dL
Leukocytes, UA: NEGATIVE
Nitrite, UA: NEGATIVE
Protein Ur, POC: NEGATIVE mg/dL
Specific Gravity, Urine: 1.025
Urobilinogen, Ur: 0.2
pH, UA: 6 (ref 5.0–8.0)

## 2023-07-08 LAB — POCT GLYCOSYLATED HEMOGLOBIN (HGB A1C): Hemoglobin A1C: 5.9 % — AB (ref 4.0–5.6)

## 2023-07-09 LAB — CBC WITH DIFFERENTIAL/PLATELET
Basophils Absolute: 0 10*3/uL (ref 0.0–0.2)
Basos: 1 %
EOS (ABSOLUTE): 0.2 10*3/uL (ref 0.0–0.4)
Eos: 3 %
Hematocrit: 45.3 % (ref 37.5–51.0)
Hemoglobin: 15 g/dL (ref 13.0–17.7)
Immature Grans (Abs): 0 10*3/uL (ref 0.0–0.1)
Immature Granulocytes: 0 %
Lymphocytes Absolute: 1.6 10*3/uL (ref 0.7–3.1)
Lymphs: 28 %
MCH: 32.6 pg (ref 26.6–33.0)
MCHC: 33.1 g/dL (ref 31.5–35.7)
MCV: 99 fL — ABNORMAL HIGH (ref 79–97)
Monocytes Absolute: 0.6 10*3/uL (ref 0.1–0.9)
Monocytes: 10 %
Neutrophils Absolute: 3.3 10*3/uL (ref 1.4–7.0)
Neutrophils: 58 %
Platelets: 239 10*3/uL (ref 150–450)
RBC: 4.6 x10E6/uL (ref 4.14–5.80)
RDW: 13 % (ref 11.6–15.4)
WBC: 5.7 10*3/uL (ref 3.4–10.8)

## 2023-07-09 LAB — LIPID PANEL
Chol/HDL Ratio: 3.2 {ratio} (ref 0.0–5.0)
Cholesterol, Total: 203 mg/dL — ABNORMAL HIGH (ref 100–199)
HDL: 64 mg/dL (ref >39)
LDL Chol Calc (NIH): 126 mg/dL — ABNORMAL HIGH (ref 0–99)
Triglycerides: 72 mg/dL (ref 0–149)
VLDL Cholesterol Cal: 13 mg/dL (ref 5–40)

## 2023-07-09 LAB — CMP14+EGFR
ALT: 19 [IU]/L (ref 0–44)
AST: 20 [IU]/L (ref 0–40)
Albumin: 4.7 g/dL (ref 3.8–4.9)
Alkaline Phosphatase: 93 [IU]/L (ref 44–121)
BUN/Creatinine Ratio: 18 (ref 9–20)
BUN: 21 mg/dL (ref 6–24)
Bilirubin Total: 0.6 mg/dL (ref 0.0–1.2)
CO2: 23 mmol/L (ref 20–29)
Calcium: 9.7 mg/dL (ref 8.7–10.2)
Chloride: 101 mmol/L (ref 96–106)
Creatinine, Ser: 1.19 mg/dL (ref 0.76–1.27)
Globulin, Total: 2.2 g/dL (ref 1.5–4.5)
Glucose: 112 mg/dL — ABNORMAL HIGH (ref 70–99)
Potassium: 4.7 mmol/L (ref 3.5–5.2)
Sodium: 138 mmol/L (ref 134–144)
Total Protein: 6.9 g/dL (ref 6.0–8.5)
eGFR: 73 mL/min/{1.73_m2} (ref >59)

## 2023-07-09 LAB — PSA: Prostate Specific Ag, Serum: 0.6 ng/mL (ref 0.0–4.0)

## 2023-07-09 LAB — FERRITIN: Ferritin: 208 ng/mL (ref 30–400)

## 2023-08-19 ENCOUNTER — Other Ambulatory Visit: Payer: Commercial Managed Care - PPO

## 2023-08-27 ENCOUNTER — Other Ambulatory Visit (INDEPENDENT_AMBULATORY_CARE_PROVIDER_SITE_OTHER): Payer: Commercial Managed Care - PPO

## 2023-08-27 DIAGNOSIS — Z23 Encounter for immunization: Secondary | ICD-10-CM | POA: Diagnosis not present

## 2023-10-04 HISTORY — PX: SKIN BIOPSY: SHX1

## 2023-10-09 ENCOUNTER — Encounter: Payer: Self-pay | Admitting: *Deleted

## 2024-06-04 ENCOUNTER — Telehealth: Admitting: Family Medicine

## 2024-06-04 ENCOUNTER — Encounter

## 2024-06-04 DIAGNOSIS — G8929 Other chronic pain: Secondary | ICD-10-CM

## 2024-06-04 DIAGNOSIS — M62830 Muscle spasm of back: Secondary | ICD-10-CM

## 2024-06-04 DIAGNOSIS — M545 Low back pain, unspecified: Secondary | ICD-10-CM

## 2024-06-04 MED ORDER — METAXALONE 800 MG PO TABS: ORAL_TABLET | ORAL | 0 refills | Status: AC

## 2024-06-04 MED ORDER — MELOXICAM 15 MG PO TABS: 15.0000 mg | ORAL_TABLET | Freq: Every day | ORAL | 0 refills | Status: AC

## 2024-06-04 NOTE — Patient Instructions (Signed)
 Stop the ibuprofen. Take meloxicam  once daily with food, every day until your pain has resolved. Use the metaxolone (muscle relaxer) if needed for spasm. Ice after activity that triggers pain (ie pickleball), otherwise use heat. Continue stretches, yoga (avoiding activities that trigger worsening pain).  Next steps would include PT, updated imaging, if you aren't improving. Be sure to follow-up if you develop sciatica (pain into the leg), numbness, tingling, weakness, bowel/bladder troubles, or if you have fever, worsening pain.  See you soon!
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
## 2024-06-07 ENCOUNTER — Encounter: Payer: Self-pay | Admitting: Family Medicine

## 2024-06-07 ENCOUNTER — Telehealth: Payer: Self-pay | Admitting: Family Medicine

## 2024-06-07 MED ORDER — ONDANSETRON 4 MG PO TBDP: 4.0000 mg | ORAL_TABLET | Freq: Three times a day (TID) | ORAL | 0 refills | Status: DC | PRN

## 2024-06-07 NOTE — Telephone Encounter (Signed)
 Wife called concerned about [place]. She reports his back had improved since telehealth visit last week--was visiting their son and walking around [place] this weekend.  He started retching today, wasn't sure if it was related to the meloxicam  (had taken it with food, hasn't had issues with it in the past). She gave him a leftover zofran  she had, and his vomiting stopped. The retching caused him to have severe pain in a different part of his back (on the right side of lower back, different than his typical pain that we had discussed). He hasn't taken the skelaxin  yet today, had only been taking it once daily in the evening/dinner.  He is in tremendous pain. His BP's have been 140's/90's. She reports he seems wobbly when walking. Mentioned the possibility of vertigo (?) Not aware of focal weakness in his lower extremities.  Advised to stay well hydrated (switch to Gatorade, reportedly drinking a lot of water). Sending in RF of zofran , if needed. Advised he could take the skelaxin  up to TID. He could take ES tylenol. Can use lidocaine patch.  To go to ER if pain unrelenting, or if any neuro symptoms.

## 2024-06-15 ENCOUNTER — Ambulatory Visit: Payer: Self-pay

## 2024-06-15 ENCOUNTER — Ambulatory Visit
Admission: RE | Admit: 2024-06-15 | Discharge: 2024-06-15 | Disposition: A | Discharge status: Home / Self Care | Source: Ambulatory Visit | Attending: Family Medicine | Admitting: Family Medicine

## 2024-06-15 ENCOUNTER — Encounter: Payer: Self-pay | Admitting: Family Medicine

## 2024-06-15 ENCOUNTER — Ambulatory Visit (INDEPENDENT_AMBULATORY_CARE_PROVIDER_SITE_OTHER): Admitting: Family Medicine

## 2024-06-15 VITALS — BP 110/70 | HR 76 | Ht 74.5 in | Wt 209.2 lb

## 2024-06-15 DIAGNOSIS — M545 Low back pain, unspecified: Secondary | ICD-10-CM

## 2024-06-15 DIAGNOSIS — G8929 Other chronic pain: Secondary | ICD-10-CM

## 2024-06-15 NOTE — Telephone Encounter (Signed)
 FYI Only or Action Required?: FYI only for provider.  Patient was last seen in primary care on 06/04/2024 by Randol Dawes, MD.  Called Nurse Triage reporting Back Pain.  Symptoms began a week ago.  Interventions attempted: Prescription medications: Muscle relaxer and Meloxicam .  Symptoms are: gradually worsening.  Triage Disposition: See PCP When Office is Open (Within 3 Days)  Patient/caregiver understands and will follow disposition?: Yes    Copied from CRM [id number]. Topic: Clinical - Red Word Triage >> Jun 15, 2024  8:03 AM [place] MATSU wrote: Red Word that prompted transfer to Nurse Triage: worsening back pain unable to do nothing without being in pain it is not getting better to deal with Reason for Disposition  [1] MODERATE back pain (e.g., interferes with normal activities) AND [2] present > 3 days  Answer Assessment - Initial Assessment Questions Denies tingling or pain down leg, but states every now and then he gets numbness in lower left leg. Patient states its hard to sit and stand for a while. Patient states when he has pressure on that area of the back it feels a little better.  1. ONSET: When did the pain begin? (e.g., minutes, hours, days)     A week ago it worsened 2. LOCATION: Where does it hurt? (upper, mid or lower back)     Lower right side 3. SEVERITY: How bad is the pain?  (e.g., Scale 1-10; mild, moderate, or severe)     9/10, but ranges 4. PATTERN: Is the pain constant? (e.g., yes, no; constant, intermittent)      Patient states if he lies down flat it mostly goes away 5. RADIATION: Does the pain shoot into your legs or somewhere else?     No 6. CAUSE:  What do you think is causing the back pain?      Patient uncertain, initially told a muscle pull but it is worsening 7. BACK OVERUSE:  Any recent lifting of heavy objects, strenuous work or exercise?     No 8. MEDICINES: What have you taken so far for the pain? (e.g., nothing, acetaminophen,  NSAIDS)     Patient was initially given Meloxicam  and muscle relaxer  9. NEUROLOGIC SYMPTOMS: Do you have any weakness, numbness, or problems with bowel/bladder control?     No 10. OTHER SYMPTOMS: Do you have any other symptoms? (e.g., fever, abdomen pain, burning with urination, blood in urine)       Nausea  Protocols used: Back Pain-A-AH

## 2024-06-15 NOTE — Patient Instructions (Addendum)
 Take anti-inflammatoy (either meloxicam  or ibuprofen or aleve with food, to help decrease any residual inflammation on the right side of the back.  There didn't seem to be any spasm present today.  Go to [facility] [address] [employer] [facility] [employer] (or [facility]) for updated x-rays of the lumbar spine.  I recommend seeing [facility] (Aart Schulenklopper or his partners) for further treatment if not improving (many modalities might help--e-stim or dry needling, or others, plus getting a good home exercise regimen).  Contact us  if fever, bowel or bladder problems, weakness or pain in the extremities (ie sciatica), or other changes. Right-sided low back pain without sciatica Chronic right-sided low back pain exacerbated by recent retching. Muscular pain, no sciatica or neurological deficits. Pain reduced from 9.5 to 7. Differential includes muscular strain and residual inflammation. - Resume meloxicam  15 mg once daily with food. - Use Skelaxin  as needed for muscle spasms. - Encouraged physical activity, including stretching and light exercise. - Consider physical therapy for dry needling, E-Stim, and home exercise if pain persists. - Order lumbar spine x-ray to assess changes since 2020. - Monitor for new neurological symptoms such as weakness, bowel or bladder issues, or significant numbness.  Degenerative disc disease of lumbar spine Degenerative disc disease confirmed by 2020 x-ray. Symptoms include intermittent numbness in left lower leg with prolonged sitting or standing. No acute neurological deficits. - Order lumbar spine x-ray to compare with 2020 findings. - Advise continuation of regular physical activity, including biking and stretching. - Discuss potential for physical therapy for symptom management and structured exercise regimen.

## 2024-06-15 NOTE — Progress Notes (Signed)
 Chief Complaint  Patient presents with   Back Pain    Worsening back pain. Nausea is better. Is doing some light stretching in the am. Has been able to use stationary bike, 4 days. When he sits for a long time he gets some slight numbness in left leg.    Gregory Wright is a 53 year old male with degenerative disc disease who presents with right-sided low back pain.  He has experienced right-sided low back pain since September 2nd, beginning with tightness while playing pickleball. The pain worsened significantly after a tournament on September 6th, leading to a locked-up sensation. Massage and cupping provided some relief. He had virtual visit on 9/11. Meloxicam  and Skelaxin  initially improved his condition, allowing him to walk around [place] the following weekend.  On September 14th, severe pain developed on the right side of his back after retching, rated as 9 to 9.5 on a pain scale, causing significant mobility issues. Vertigo, severe sweating, vomiting, and dry heaving worsened the pain. He took zofran  (wife's), and had no further vomiting. He took skelaxin  more regularly and used lidocaine patch, and heat, which helped. By September 15th, the pain decreased to a 7. Prolonged sitting or standing increases tightness, but he can stretch and perform light activities like using a stationary bike. Current pain is right-sided and different from the initial pain. Range of motion has improved, but a pull on the right side persists. He engages in daily stretching and light exercise. Today is better than last week.  He still reports tightness with sitting and standing for long times. He has some discomfort with movements. No pain currently in the office. He gets slight numbness in LLE if sitting or standing for too long (rated as 2-3/10 intensity of the numbness).  He denies any weakness or radiation of pain.  ROM is better (at his last visit, he could barely touch his toes), just has a pulling sensation on  the right lower back. All the discomfort he has now is right-sided.    He has had intermittent problems with LBP for many years, with DDD seen at L5-S1 on x-rays in 09/2018.  He hadn't needed meloxicam  or skelaxin  over the last 2 years, had been able to manage stiffness/discomfort with stretches, yoga, regular massages and cupping until recently.     PMH, PSH, SH reviewed  Outpatient Encounter Medications as of 06/15/2024  Medication Sig Note   fluticasone (FLONASE) 50 MCG/ACT nasal spray Place 2 sprays into both nostrils daily as needed.  06/15/2024: As needed   ibuprofen (ADVIL,MOTRIN) 400 MG tablet Take 400 mg by mouth 2 (two) times daily as needed.  06/15/2024: As needed   NON FORMULARY Take 1 each by mouth at bedtime. 06/15/2024: Last dose Sat   ondansetron  (ZOFRAN -ODT) 4 MG disintegrating tablet Take 1 tablet (4 mg total) by mouth every 8 (eight) hours as needed for nausea or vomiting. 06/15/2024: Last dose 9/18   meloxicam  (MOBIC ) 15 MG tablet Take 1 tablet (15 mg total) by mouth daily. (Patient not taking: Reported on 06/15/2024)    metaxalone  (SKELAXIN ) 800 MG tablet TAKE 1/2-1 TABLET (400-800 MG TOTAL) BY MOUTH 3 (THREE) TIMES DAILY AS NEEDED FOR MUSCLE SPASMS. (Patient not taking: Reported on 06/15/2024)    No facility-administered encounter medications on file as of 06/15/2024.   No Known Allergies  ROS: no f/c, URI symptoms. No further vertigo, n/v, no bowel or bladder changes.  Intermittent LLE numbness per HPI. No other tingling, no weakness.  See HPI  PHYSICAL EXAM:  BP 110/70   Pulse 76   Ht 6' 2.5 (1.892 m)   Wt 209 lb 3.2 oz (94.9 kg)   BMI 26.50 kg/m   Wt Readings from Last 3 Encounters:  06/15/24 209 lb 3.2 oz (94.9 kg)  06/04/24 215 lb (97.5 kg)  07/08/23 212 lb 12.8 oz (96.5 kg)   Well-appearing, pleasant male in no distress HEENT: conjunctiva and sclera are clear, EOMI. Neck: no lymphadenopathy or mass, no cervical tenderness Back: no spinal or CVA  tenderness. Area of discomfort is right sided lumbar area, but no appreciable spasm is palpable. Nontender to palpation.  Neuro: normal strength, sensation, negative SLR (slight pulling in R back with straightening of left leg, no radiation in legs). Normal gait. DTR's 2+ and symmetric at knees, diminished at bilateral ankles. Psych: normal mood, affect, hygiene and grooming Heart: regular rate and rhythm Lungs: clear bilaterally    ASSESSMENT/PLAN:  Chronic midline low back pain without sciatica - has known DDD. This improved with use of meloxicam  and skelaxin  initially, with stretches and massage - Plan: [facility] Lumbar Spine Complete  Acute right-sided low back pain without sciatica - suspect pulled muscle, gradually improving. No spasm on exam today. Rec continued NSAIDs until better. Would benefit from PT modalities and HEP - Plan: [facility] Lumbar Spine Complete  Take anti-inflammatoy (either meloxicam  or ibuprofen or aleve with food, to help decrease any residual inflammation on the right side of the back.  There didn't seem to be any spasm present today.  Go to [facility] [address] [facility] (or [facility]) for updated x-rays of the lumbar spine.  I recommend seeing [facility] (Aart Schulenklopper or his partners) for further treatment if not improving (many modalities might help--e-stim or dry needling, or others, plus getting a good home exercise regimen).  Contact us  if fever, bowel or bladder problems, weakness or pain in the extremities (ie sciatica), or other changes.

## 2024-06-19 ENCOUNTER — Ambulatory Visit: Payer: Self-pay | Admitting: Family Medicine

## 2024-07-07 ENCOUNTER — Encounter: Payer: Self-pay | Admitting: *Deleted

## 2024-07-08 NOTE — Patient Instructions (Incomplete)

## 2024-07-08 NOTE — Progress Notes (Unsigned)
 No chief complaint on file.  Gregory Wright is a 53 y.o. male who presents for a complete physical.   LBP flared in September. Seen for f/u 9/22 at which time he still had ongoing tightness with sitting/standing for long periods of times.  He had been improving.  I recommended continued use of NSAIDS, muscle relaxants prn only (wasn't having spasm at time of visit), and to consider Physical Therapy (discussed [facility]) as potentially beneficial for treatment, and giving HEP. X-rays showed that degenerative changes have progressed since 2020. We can consider referral for possible injections if/when your pain is worse   Hypercholesterolemia: h/o borderline values, with LDL in the 140-150 range, <859 the last few years. Last year he reported the following diet: Some cheese/crackers, red meat up to 2x/week, mostly chicken/fish. At that time he was eating 8-10 eggs/week, along with [place] bacon.  *** Current diet  He is fasting for repeat labs today.   Component Ref Range & Units (hover) 1 yr ago 2 yr ago 3 yr ago 4 yr ago 5 yr ago 6 yr ago 7 yr ago  Cholesterol, Total 203 High  215 High  218 High  198 219 High  215 High    Triglycerides 72 74 100 80 78 85 69 R  HDL 64 66 62 58 58 55 61 R  VLDL Cholesterol Cal 13 13 18 14 16 17    LDL Chol Calc (NIH) 126 High  136 High  138 High  126 High      Chol/HDL Ratio 3.2 3.3 CM 3.5 CM 3.4 CM 3.8 CM 3.9 CM 3.7 R    Impaired fasting glucose:  A1c was 5.9% last year.  He drinks 2-3 Coke Zeros/day at home (has regular if at [employer], for lunch, no refills; will get Coke Zero if they have it).  Continues to drink a lot of water, G2 or Powerade Zero once/week.   He eats a lot of bread (daily sandwiches, turkey)--honey wheat bread, not much rice/pasta, and tries to limit candy (cut back on Jelly Belly and [employer]). Limits beer to 4-5 on the weekends, rarely during the week, maybe 1 after pickleball.  Component Ref Range & Units (hover) 1 yr ago 2 yr  ago 3 yr ago 4 yr ago 5 yr ago 6 yr ago 7 yr ago  Hemoglobin A1C 5.9 Abnormal  6.0 High  R, CM 5.8 Abnormal  5.6 5.9 High  R, CM 5.9 High  R, CM 5.6 R, CM    He was noted to have mild OSA on sleep study in 07/2017.  Some nights are better than others (re: snoring, per wife).  He feels well rested in the mornings, no daytime somnolence.  Insomnia: At one point he was taking CBD gummies every night, but switched to 50mg  Delta 8 at bedtime.  He was able to cut back the dose to 25 mg once he stopped working. With the lower dose Delta 8 nightly, he sleeps well and feels well rested in the mornings. He does more meditation at night, before going to sleep.   Immunization History  Administered Date(s) Administered   Hepatitis A, Adult 02/19/2023, 08/27/2023   Hepb-cpg 02/19/2023, 03/25/2023   Influenza, Seasonal, Injecte, Preservative Fre 07/08/2023   Influenza,inj,Quad PF,6+ Mos 05/14/2019, 05/26/2020, 06/05/2021, 06/13/2022   PFIZER(Purple Top)SARS-COV-2 Vaccination 11/22/2019, 12/20/2019, 08/09/2020   Td 04/25/2017   Tdap 03/26/2007   Zoster Recombinant(Shingrix) 07/22/2020, 09/21/2020   Last colonoscopy: 07/2020, Dr. Legrand.  Diverticulosis. Recheck 10 years.  Last PSA:  Lab Results  Component Value Date   PSA1 0.6 07/08/2023   PSA1 0.6 06/13/2022   PSA1 0.6 06/05/2021  Ophtho: yearly Dentist: regularly, 2x/year Exercise:   Using Pel[employer] for biking, yoga.  1 hour/day, 6 days/week--walking/biking (20-45 mins of cardio), yoga/stretching.  Weights 3 days/week Pickleball once a week.  Vitamin D -OH was 33 in 04/2017  Sees dermatologist yearly  PMH, PSH, SH and FH were reviewed and updated.     ROS: The patient denies anorexia, fever, headaches, vision loss, decreased hearing, ear pain, hoarseness, chest pain, palpitations, dizziness, syncope, dyspnea on exertion, cough, swelling, nausea, vomiting, diarrhea, constipation, abdominal pain, melena, hematochezia, hematuria, incontinence,  erectile dysfunction, nocturia, weakened urine stream, dysuria, joint pains, numbness, tingling, weakness, tremor, suspicious skin lesions, depression, anxiety, abnormal bleeding/bruising, or enlarged lymph nodes    Seasonal allergies, mainly in the Spring. Low back stiffness/pain per HPI.  Mild snoring, no daytime somnolence Insomnia per HPI  12/2022 had cortisone injection to R knee (prior injection to L knee in 2023).  Has some discomfort if he is playing a lot of pickleball. No knee pain with walking/biking.   PHYSICAL EXAM:  There were no vitals taken for this visit.  Wt Readings from Last 3 Encounters:  06/15/24 209 lb 3.2 oz (94.9 kg)  06/04/24 215 lb (97.5 kg)  07/08/23 212 lb 12.8 oz (96.5 kg)    General Appearance:  Alert, cooperative, no distress, appears stated age    Head:   Normocephalic, without obvious abnormality, atraumatic    Eyes:   PERRL, conjunctiva/corneas clear, EOM's intact, fundi benign    Ears:   Normal TM's and external ear canals  Nose:   Normal, no drainage or sinus tenderness  Throat:   Normal mucosa, no lesions  Neck:   Supple, no lymphadenopathy; thyroid: no enlargement/ tenderness/ nodules; no carotid bruit or JVD    Back:   Spine nontender, no curvature, ROM normal, no CVA tenderness    Lungs:   Clear to auscultation bilaterally without wheezes, rales or ronchi; respirations unlabored    Chest Wall:   No tenderness or deformity    Heart:   Regular rate and rhythm, S1 and S2 normal, no murmur, rub or gallop   Breast Exam:  No chest wall tenderness, masses or gynecomastia   Abdomen:   Soft, non-tender, nondistended, normoactive bowel sounds,   no masses, no hepatosplenomegaly    Genitalia:   Normal male external genitalia without lesions. Testicles without masses. No inguinal hernias. No inguinal lymphadenopathy   Rectal:   Normal sphincter tone, no masses or tenderness; guaiac negative stool. Prostate smooth, no nodules, not enlarged.     Extremities:   No clubbing, cyanosis or edema.   Pulses:   2+ and symmetric all extremities    Skin:   Skin color, texture, turgor normal, no rashes or lesions.    Lymph nodes:   Cervical, supraclavicular, and inguinal nodes normal   Neurologic:   Normal strength, sensation and gait; reflexes 2+ and symmetric throughout                 Psych:  Normal mood, affect, hygiene and grooming  A1c   ASSESSMENT/PLAN:   A1c PSA, cbc, c-met, lipids  TSH only if sx  Flu, covid Prevnar-20  (Just 2 at once)   Risks/benefits of PSA screening reviewed, done today. Recommended at least 30 minutes of aerobic activity at least 5 days/week, weight-bearing exercise 2x/week. Counseled regarding alcohol use, sunscreen, carbon monoxide  detectors, changing batteries in smoke detectors, safe driving.  Immunization recommendations discussed-- flu shot given today.  *** COVID booster recommended, but declined. *** Prevnar-20 *** Colon cancer screening is up to date.  F/u 1 year, sooner prn

## 2024-07-09 ENCOUNTER — Encounter: Payer: Self-pay | Admitting: Family Medicine

## 2024-07-09 ENCOUNTER — Ambulatory Visit (INDEPENDENT_AMBULATORY_CARE_PROVIDER_SITE_OTHER): Payer: Commercial Managed Care - PPO | Admitting: Family Medicine

## 2024-07-09 VITALS — BP 110/70 | HR 68 | Ht 75.0 in | Wt 209.4 lb

## 2024-07-09 DIAGNOSIS — R7301 Impaired fasting glucose: Secondary | ICD-10-CM | POA: Diagnosis not present

## 2024-07-09 DIAGNOSIS — R351 Nocturia: Secondary | ICD-10-CM | POA: Diagnosis not present

## 2024-07-09 DIAGNOSIS — E7800 Pure hypercholesterolemia, unspecified: Secondary | ICD-10-CM | POA: Diagnosis not present

## 2024-07-09 DIAGNOSIS — Z23 Encounter for immunization: Secondary | ICD-10-CM

## 2024-07-09 DIAGNOSIS — G2581 Restless legs syndrome: Secondary | ICD-10-CM

## 2024-07-09 DIAGNOSIS — Z125 Encounter for screening for malignant neoplasm of prostate: Secondary | ICD-10-CM

## 2024-07-09 DIAGNOSIS — Z0000 Encounter for general adult medical examination without abnormal findings: Secondary | ICD-10-CM | POA: Diagnosis not present

## 2024-07-09 LAB — POCT GLYCOSYLATED HEMOGLOBIN (HGB A1C): Hemoglobin A1C: 5.7 % — AB (ref 4.0–5.6)

## 2024-07-10 ENCOUNTER — Ambulatory Visit: Payer: Self-pay | Admitting: Family Medicine

## 2024-07-10 LAB — LIPID PANEL
Chol/HDL Ratio: 3.3 ratio (ref 0.0–5.0)
Cholesterol, Total: 207 mg/dL — ABNORMAL HIGH (ref 100–199)
HDL: 63 mg/dL (ref >39)
LDL Chol Calc (NIH): 130 mg/dL — ABNORMAL HIGH (ref 0–99)
Triglycerides: 78 mg/dL (ref 0–149)
VLDL Cholesterol Cal: 14 mg/dL (ref 5–40)

## 2024-07-10 LAB — COMPREHENSIVE METABOLIC PANEL WITH GFR
ALT: 16 IU/L (ref 0–44)
AST: 16 IU/L (ref 0–40)
Albumin: 4.8 g/dL (ref 3.8–4.9)
Alkaline Phosphatase: 79 IU/L (ref 47–123)
BUN/Creatinine Ratio: 18 (ref 9–20)
BUN: 24 mg/dL (ref 6–24)
Bilirubin Total: 0.7 mg/dL (ref 0.0–1.2)
CO2: 22 mmol/L (ref 20–29)
Calcium: 10 mg/dL (ref 8.7–10.2)
Chloride: 100 mmol/L (ref 96–106)
Creatinine, Ser: 1.31 mg/dL — ABNORMAL HIGH (ref 0.76–1.27)
Globulin, Total: 2.3 g/dL (ref 1.5–4.5)
Glucose: 110 mg/dL — ABNORMAL HIGH (ref 70–99)
Potassium: 4.6 mmol/L (ref 3.5–5.2)
Sodium: 137 mmol/L (ref 134–144)
Total Protein: 7.1 g/dL (ref 6.0–8.5)
eGFR: 65 mL/min/1.73 (ref >59)

## 2024-07-10 LAB — CBC WITH DIFFERENTIAL/PLATELET
Basophils Absolute: 0 x10E3/uL (ref 0.0–0.2)
Basos: 0 %
EOS (ABSOLUTE): 0.1 x10E3/uL (ref 0.0–0.4)
Eos: 2 %
Hematocrit: 44.4 % (ref 37.5–51.0)
Hemoglobin: 14.9 g/dL (ref 13.0–17.7)
Immature Grans (Abs): 0 x10E3/uL (ref 0.0–0.1)
Immature Granulocytes: 0 %
Lymphocytes Absolute: 1.7 x10E3/uL (ref 0.7–3.1)
Lymphs: 34 %
MCH: 32.3 pg (ref 26.6–33.0)
MCHC: 33.6 g/dL (ref 31.5–35.7)
MCV: 96 fL (ref 79–97)
Monocytes Absolute: 0.4 x10E3/uL (ref 0.1–0.9)
Monocytes: 9 %
Neutrophils Absolute: 2.7 x10E3/uL (ref 1.4–7.0)
Neutrophils: 55 %
Platelets: 251 x10E3/uL (ref 150–450)
RBC: 4.61 x10E6/uL (ref 4.14–5.80)
RDW: 12.2 % (ref 11.6–15.4)
WBC: 5 x10E3/uL (ref 3.4–10.8)

## 2024-07-10 LAB — PSA: Prostate Specific Ag, Serum: 0.8 ng/mL (ref 0.0–4.0)

## 2024-10-13 ENCOUNTER — Ambulatory Visit: Payer: Self-pay

## 2024-10-13 NOTE — Progress Notes (Unsigned)
 No chief complaint on file.  Yesterday he woke up with a nervous/anxious feeling, not normal for him.  About a month ago he and his family went on a cruise. His son had some congestion, and he had also developed a little bit of congestion and cough. A few days after having the cough he notices like a heaviness in his epigastric area and thought it was from coughing hard. He states that feeling has since gone away but over the last week or so he gets a nervousness, anxiety, fluttering feeling just below his sternum.   He states he has an old friend who is a retired development worker, community and he called him and stated he didn't feel like it was anything urgent but should still make an appt to be seen.   Pt denies pain, states it feels like butterflies in the stomach, just below the sternum. He does still have a dry cough.  He describes it as anxiety and nervousness.  He has no symptoms related to exercise--yesterday rode the [place] for 10 miles, no chest pain, no shortness of breath or pain anywhere.        PMH, PSH, SH reviewed   ROS:    PHYSICAL EXAM:  There were no vitals taken for this visit.  Wt Readings from Last 3 Encounters:  07/09/24 209 lb 6.4 oz (95 kg)  06/15/24 209 lb 3.2 oz (94.9 kg)  06/04/24 215 lb (97.5 kg)       ASSESSMENT/PLAN:

## 2024-10-13 NOTE — Telephone Encounter (Signed)
 "    Reason for Triage: Pt reports occasionally having a heaviness feeling in chest, and anxious/nervous feeling, denies shortness of breath at present.   As of this morning, woke up with a nervous, anxious feeling, and is ongoing, and feels different not normal for him.   Also reports some coughing, but not as bad as it was several weeks ago.   Pt requesting appt for evaluation.     Reason for Disposition  [1] Anxiety symptoms AND [2] has not been evaluated for this by doctor (or NP/PA)  [1] Chest pain lasts > 5 minutes AND [2] occurred > 3 days ago (72 hours) AND [3] NO chest pain or cardiac symptoms now  Answer Assessment - Initial Assessment Questions Pt states about a month ago he and his family went on a cruise. His son had some congestions and had to be seen. Pt had a little bit of congestion and cough. A few days after having the cough he notices like a heaviness in his epigastric area and thought it was from coughing hard. He states that feeling has since gone away but over the last week or so he gets a nervousness, anxiety, fluttering feeling just below his sternum. He states he has an old friend who is a retired development worker, community and he called him and stated he didn't feel like it was anything urgent but should still make an appt to be seen. Pt continues to deny pain and states it feels like butterflies in your stomach but just below the sternum. He does still have a dry cough. He continues to call it anxiety and nervousness. He denies any current higher acuity symptoms. He states he worked out today and felt fine, road the [place] for 10 miles, no chest pain, no shortness of breath or pain anywhere. RN did give strict instructions if this turns into pain or if he has other symptoms like shortness of breath to go to the Er. He stated understanding.        1. LOCATION: Where does it hurt?       No pain, fluttering or like butterflies just below sternum 2. RADIATION: Does the pain go  anywhere else? (e.g., into neck, jaw, arms, back)     denies 3. ONSET: When did the chest pain begin? (Minutes, hours or days)      Intermittent the last week or so 4. PATTERN: Does the pain come and go, or has it been constant since it started?  Does it get worse with exertion?      Intermittent. Nothing really makes it worse except thinking about it, when he doesn't think about it, he doesn't notice it. 5. DURATION: How long does it last (e.g., seconds, minutes, hours)     Depends on how long he's thinking about it 6. SEVERITY: How bad is the pain?  (e.g., Scale 1-10; mild, moderate, or severe)     0 7. CARDIAC RISK FACTORS: Do you have any history of heart problems or risk factors for heart disease? (e.g., angina, prior heart attack; diabetes, high blood pressure, high cholesterol, smoker, or strong family history of heart disease)     no 8. PULMONARY RISK FACTORS: Do you have any history of lung disease?  (e.g., blood clots in lung, asthma, emphysema, birth control pills)     osa 9. CAUSE: What do you think is causing the chest pain?     unknown 10. OTHER SYMPTOMS: Do you have any other symptoms? (e.g., dizziness, nausea, vomiting, sweating,  fever, difficulty breathing, cough)       He did endorse has had some episodes of dizziness if he gets up too fast  Answer Assessment - Initial Assessment Questions Pt having fluttering/butterflies below sternum he thinks it might be anxiety but would like to see Dr. Randol to discuss    1. CONCERN: Did anything happen that prompted you to call today?      A butterfly feeling below sternum 2. ANXIETY SYMPTOMS: Can you describe how you (your loved one; patient) have been feeling? (e.g., tense, restless, panicky, anxious, keyed up, overwhelmed, sense of impending doom).      anxious 3. ONSET: How long have you been feeling this way? (e.g., hours, days, weeks)     About a week or more 4. SEVERITY: How would you rate the  level of anxiety? (e.g., 0 - 10; or mild, moderate, severe).     Depends on if he's thinking about it 5. FUNCTIONAL IMPAIRMENT: How have these feelings affected your ability to do daily activities? Have you had more difficulty than usual doing your normal daily activities? (e.g., getting better, same, worse; self-care, school, work, interactions)     no  Protocols used: Chest Pain-A-AH, Anxiety and Panic Attack-A-AH  "

## 2024-10-13 NOTE — Telephone Encounter (Signed)
 FYI Only or Action Required?: FYI only for provider: appointment scheduled on 1.21.26.  Patient was last seen in primary care on 07/09/2024 by Randol Dawes, MD.  Called Nurse Triage reporting Chest Pain and Anxiety.  Symptoms began several weeks ago.  Interventions attempted: Nothing.  Symptoms are: unchanged.  Triage Disposition: See Physician Within 24 Hours, See PCP When Office is Open (Within 3 Days)  Patient/caregiver understands and will follow disposition?: Yes

## 2024-10-14 ENCOUNTER — Ambulatory Visit (INDEPENDENT_AMBULATORY_CARE_PROVIDER_SITE_OTHER): Admitting: Family Medicine

## 2024-10-14 ENCOUNTER — Encounter: Payer: Self-pay | Admitting: Family Medicine

## 2024-10-14 VITALS — BP 122/86 | HR 72 | Temp 98.3°F | Ht 75.0 in | Wt 220.0 lb

## 2024-10-14 DIAGNOSIS — K219 Gastro-esophageal reflux disease without esophagitis: Secondary | ICD-10-CM

## 2024-10-14 DIAGNOSIS — R002 Palpitations: Secondary | ICD-10-CM

## 2024-10-14 NOTE — Patient Instructions (Signed)
 We discussed the possibility of gastroesophageal reflux. Pay attention to when this happens again. The nighttime orange juice and late evening could have been a trigger. We discussed, if this is a pattern, that you can use either Prilosec OTC or Pepcid AC PRIOR to any triggering food/meal, in order to prevent the symptoms form coming on. Next time, if you already develop symptoms, you can try taking an antacid to see if it relieves it.  If you have this occur frequently, then it would be worth taking a 2 week course of omeprazole to decrease any inflammation of the esophagus.

## 2025-08-04 ENCOUNTER — Encounter: Payer: Self-pay | Admitting: Family Medicine
